# Patient Record
Sex: Male | Born: 1960 | Race: White | Hispanic: No | Marital: Married | Smoking: Never smoker
Health system: Southern US, Community
[De-identification: ages and names within clinical notes are randomized; demographics above are authoritative.]

## PROBLEM LIST (undated history)

## (undated) DIAGNOSIS — E7800 Pure hypercholesterolemia, unspecified: Secondary | ICD-10-CM

## (undated) DIAGNOSIS — K219 Gastro-esophageal reflux disease without esophagitis: Secondary | ICD-10-CM

## (undated) DIAGNOSIS — I471 Supraventricular tachycardia, unspecified: Secondary | ICD-10-CM

## (undated) DIAGNOSIS — F1010 Alcohol abuse, uncomplicated: Secondary | ICD-10-CM

## (undated) DIAGNOSIS — I10 Essential (primary) hypertension: Secondary | ICD-10-CM

## (undated) DIAGNOSIS — F419 Anxiety disorder, unspecified: Secondary | ICD-10-CM

## (undated) HISTORY — PX: RADIOFREQUENCY ABLATION: SHX2290

## (undated) HISTORY — PX: COLONOSCOPY: SHX174

---

## 2006-04-08 ENCOUNTER — Emergency Department (HOSPITAL_COMMUNITY): Admission: EM | Admit: 2006-04-08 | Discharge: 2006-04-08 | Payer: Self-pay | Admitting: *Deleted

## 2006-04-22 ENCOUNTER — Emergency Department (HOSPITAL_COMMUNITY): Admission: EM | Admit: 2006-04-22 | Discharge: 2006-04-22 | Payer: Self-pay | Admitting: Emergency Medicine

## 2006-07-08 ENCOUNTER — Emergency Department (HOSPITAL_COMMUNITY): Admission: EM | Admit: 2006-07-08 | Discharge: 2006-07-08 | Payer: Self-pay | Admitting: Emergency Medicine

## 2007-02-10 ENCOUNTER — Emergency Department (HOSPITAL_COMMUNITY): Admission: EM | Admit: 2007-02-10 | Discharge: 2007-02-10 | Payer: Self-pay | Admitting: Emergency Medicine

## 2007-03-31 ENCOUNTER — Ambulatory Visit: Payer: Self-pay | Admitting: Internal Medicine

## 2007-07-01 ENCOUNTER — Observation Stay (HOSPITAL_COMMUNITY): Admission: RE | Admit: 2007-07-01 | Discharge: 2007-07-01 | Payer: Self-pay | Admitting: Internal Medicine

## 2007-07-01 ENCOUNTER — Ambulatory Visit: Payer: Self-pay | Admitting: Internal Medicine

## 2010-10-06 ENCOUNTER — Encounter: Payer: Self-pay | Admitting: Orthopedic Surgery

## 2011-01-28 NOTE — Discharge Summary (Signed)
Sean Booth, SALADIN NO.:  [id number]   MEDICAL RECORD NO.:  [id number]          PATIENT TYPE:  INP   LOCATION:  3739                         FACILITY:  [facility]   PHYSICIAN:  Duke Salvia, MD, FACCDATE OF BIRTH:  1961/04/20   DATE OF ADMISSION:  07/01/2007  DATE OF DISCHARGE:  07/01/2007                               DISCHARGE SUMMARY   PROCEDURES:  Supraventricular tachycardia ablation.   FINAL DIAGNOSIS:  Supraventricular tachycardia.   SECONDARY DIAGNOSES:  1. Hypertension  2. Hyperlipidemia.   TIME AT DISCHARGE:  Twenty-two minutes.   HOSPITAL COURSE:  [name] is a 49 year old male with a history of  SVT.  He was evaluated by Dr. 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  It was felt that he needed ablation, as his symptoms were  not well controlled on metoprolol 50 mg a day, but his blood pressure  would not tolerate an increase.  He came to the hospital for this on  July 01, 2007.  [name] had successful radiofrequency catheter  ablation of narrow complex AVNRT with a cycle length of 297  milliseconds.  Postprocedure, he was without chest pain or shortness of  breath.  His catheter sites were without hematoma.  He was discharged on  July 01, 2007 and is to follow up as an outpatient with the Dr.  Eldridge Dace.   DISCHARGE INSTRUCTIONS:  1. His activity level is to be increased gradually.  2. He is to call our office for any problems with his cath site.  3. He is to keep his followup appointment with Dr. Eldridge Dace in      December.  4. He is to follow up with Dr. Clarene Duke and Dr. Graciela Husbands as needed.   DISCHARGE MEDICATIONS:  1. Lisinopril 40 mg daily.  2. Metoprolol 50 mg daily.  3. Simvastatin 40 mg daily.      Theodore Demark, PA-C      Duke Salvia, MD, [facility]  Electronically Signed    RB/MEDQ  D:  07/01/2007  T:  07/03/2007  Job:  [id number]   cc:   [name], MD  Anna Genre Little, M.D.

## 2011-01-28 NOTE — Op Note (Signed)
NAMEAVANT, PRINTY NO.:  [id number]   MEDICAL RECORD NO.:  [id number]          PATIENT TYPE:  INP   LOCATION:  3739                         FACILITY:  [facility]   PHYSICIAN:  Duke Salvia, MD, FACCDATE OF BIRTH:  1961/02/06   DATE OF PROCEDURE:  07/01/2007  DATE OF DISCHARGE:  07/01/2007                               OPERATIVE REPORT   PREOPERATIVE DIAGNOSIS:  Supraventricular tachycardia.   POSTOPERATIVE DIAGNOSIS:  Slow--fast atrioventricular nodal reentry  tachycardia.   PROCEDURE:  1. Invasive electrophysiological study.  2. Arrhythmia mapping.  3. Isoproterenol infusion and radiofrequency catheter ablation.   DESCRIPTION OF PROCEDURE:  Following the attainment of informed consent,  the patient was brought to the electrophysiology laboratory and placed  on the fluoroscopic table in supine position.  After routine prep and  drape, cardiac catheterization was performed with local anesthesia and  conscious sedation.  Noninvasive blood pressure monitoring,  transcutaneous oxygen saturation monitoring and end-tidal CO2 monitoring  were performed continuously throughout the procedure.  Following the  procedure, the catheters were removed, hemostasis was obtained and the  patient was transferred to the holding area which sheaths in place.   Surface leads I, aVF and V1 were monitored continuously throughout the  procedure.  Following insertion of the catheters, the stimulation  protocol included incremental atrial pacing and incremental ventricular  pacing.   Single atrial extrastimuli at 600, 500 and 400 milliseconds with double  atrial extrastimuli at 500 and 400 milliseconds.   Single ventricular extrastimuli at a paced cycle length of 600  milliseconds.   RESULTS:   SURFACE ELECTROCARDIOGRAM:   BASIC INTERVALS:   INITIAL:  Rhythm:  Sinus.  R-R interval 698 milliseconds.  P-R interval  140 milliseconds.  QRS duration 86 milliseconds.  P-wave  duration 116  milliseconds.  Bundle branch block:  Absent.  Pre-excitation:  Absent.  A-H interval 89 milliseconds; H-A interval 41 milliseconds.   FINAL:  Rhythm:  Sinus.  R-R interval 681 milliseconds.  P-R interval  145 milliseconds.  QRS duration 86 milliseconds.  Q-T interval 363  milliseconds.  P-wave duration 98 milliseconds.  Bundle branch block:  Absent.  Pre-excitation:  Absent.   A-H interval 66 milliseconds.  H-A interval 42 milliseconds.   AV NODAL FUNCTION.:  AV Wenckebach was less than 300 milliseconds.  VA  Wenckebach was 320 milliseconds.   AV nodal effective refractory period of the fast pathway pre ablation  was very short.  AV nodal conduction was discontinuous with echoes and  inducible tachycardia.   Post ablation:  In the absence of isoproterenol, no evidence of slow  pathway conduction was identified with an ERP at 500:360 milliseconds.  In the presence of isoproterenol, an isolated slowly conducting echo  beat was seen with an A-H interval of 340 milliseconds.   ACCESSORY PATHWAY FUNCTION:  No evidence of accessory pathway was  identified.   Ventricular response to programmed stimulation was negative for  ventricular stimulation as noted.   ARRHYTHMIAS INDUCED:  Slow-fast AV nodal reentry tachycardia was non-  reproducibly inducible with coronary sinus burst pacing.  The  tachycardia was both narrow  and with right bundle branch block  aberration, cycle length was approximately 290-300 milliseconds.  V-A  times were 0.  A-H time were 260 milliseconds and H-A times were about  35 milliseconds.  It could be reproducibly terminated with ventricular  pacing at 250 milliseconds, although sometimes it resulted in right  bundle branch block aberration.   Following RF application, no further tachycardia was inducible and the  end points were as noted previously; that is to say, no evidence of slow  pathway function was seen in the absence of isoproterenol.  An  isolated  echo beat with a long A-H interval was seen with isoproterenol.   FLUOROSCOPY TIME:  A total of 5 minutes and 53 seconds of fluoroscopy  time were utilized at 7.5 frames per second.   RADIOFREQUENCY ENERGY:  A total of 35 seconds of RF energy were applied  in 3 applications, the first of which induced no junctional rhythm, the  second of which induced junctional rhythm, but there was dislodgement  inferocaudally of the catheter and the third of which was continued  along towards the CS os, but not continued all the way back to the CS  os.   IMPRESSION:  1. Normal sinus function.  2. Normal atrial function.  3. Abnormal atrioventricular nodal function with dual slow-fast      atrioventricular nodal reentry tachycardia inducible; this required      isoproterenol.  Following slow pathway modification, no evidence of      antegrade slow pathway function was seen in the absence of      isoproterenol and an isolated echo was seen in the presence of      isoproterenol without induction of tachycardia.  4. Normal His-Purkinje system function.  5. No accessory pathway.  6. Normal ventricular response to programmed stimulation.   SUMMARY AND CONCLUSION:  The results of electrophysiological testing  identified slow-fast atrioventricular nodal reentrant tachycardia as the  patient's arrhythmia mechanism.  Slow pathway modification eliminated  the  substrate for the patient's arrhythmia.  This is associated with a long-  term likelihood of freedom from recurrence of greater than 95%.   The patient will be observed overnight.  Aspirin daily for 6 weeks and  endocarditis prophylaxis for 6 weeks.      Duke Salvia, MD, [facility]  Electronically Signed     SCK/MEDQ  D:  07/01/2007  T:  07/02/2007  Job:  [id number]   cc:   [name], MD  [facility]

## 2011-01-28 NOTE — Letter (Signed)
March 31, 2007    Sean Crafts, MD  [address] [place], [place] [address]   RE:  Sean, Booth  MRN:  [id number]  /  DOB:  May 31, 1961   Dear Sean Booth:   It was a pleasure to see Sean Booth at your request because  of supraventricular tachycardia.   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  They are associated with some mild degree of shortness of  breath, some clamminess, but no significant lightheadedness or chest  pain.  They are FROG  negative and diuretic negative.  He has tried  Valsalva to no effect and hence has required the IV.   He has undergone a cardiac evaluation which has included echo that was  normal.  He also has hypertension, dyslipidemia and obesity and for the  former he was put on a beta blocker, despite which he has continued to  have tachycardia.   He thinks that he may have had some remote episodes years and years ago.   PAST MEDICAL HISTORY:  In addition to the above is notable for his  cardiac risk factors and obesity.   PAST SURGICAL HISTORY:  Is notable for some eye surgery as an infant.   REVIEW OF SYSTEMS:  Broadly negative apart from the above.   CURRENT MEDICATIONS:  Include:  1. Lisinopril 40.  2. Metoprolol 50.  3. Simvastatin 40.   He has no known drug allergies.   SOCIAL HISTORY:  He is married.  He has a 16-year-old daughter, though I  am seeing the number of children here is misquoted with that.  He does  not use cigarettes or recreational drugs.  He does drink alcohol up to 4  bourbons a day.   EXAMINATION:  He is a well-developed, well-nourished Caucasian male  appearing his stated age of 31.  His blood pressure is 138/106 and a recheck was 118/98.  His pulse is  90.  HEENT:  Demonstrates __________.  The neck  veins were flat.  Carotids were brisk and full bilaterally  without bruits.  BACK:  Without kyphosis, scoliosis.  LUNGS:  Clear.  HEART:  Sounds were regular without murmurs or gallops.  ABDOMEN:  Soft with active bowel sounds.  It was protuberant.  The femoral pulses were 2+, distal pulses were intact.  There is no  clubbing, cyanosis or edema.  NEUROLOGICAL:  Grossly normal.  SKIN:  Warm and dry.   Electrocardiogram dated today demonstrated sinus Sean 90 with  intervals of 0.15/0.08/0.35, the axis was 40 degrees.   A tracing of his tachycardia demonstrated a narrow QRS tachycardia that  terminated abruptly with a wide complex irregular Sean.  There has  been other episodes of the same wide complex Sean within the  presumption of sinus Sean.   IMPRESSION:  1. Supraventricular tachycardia, probably AV reentry.  2. Normal left ventricular function.  3. Cardiac risk factor notable for the metabolic syndrome manifest by      a.     Abdominal obesity.      b.     Hypertension.      c.     Dyslipidemia.  4. Notable alcohol use.   Sean Booth, Sean Booth has supraventricular tachycardia, probably  mediated by an accessory  pathway.  We discussed treatment options  including ongoing therapy with beta blockers with the potential for  proarrhythmia and EP study with a catheter ablation.  We talked about  the potential successes as well as the potential risks including but not  limited to death, perforation, heart block, requiring pacemaker  implantation, stroke and perforation.  He understands these risks.  He  would like to consider this further.   In addition, I spent some time encouraging him on weight loss and long  term risk factor modification.  I did not approach risk factor  modification.   He is to let us know if he would like to proceed with catheter ablation.    Sincerely,      Sean Salvia, MD, [facility]  Electronically Signed    SCK/MedQ  DD: 03/31/2007   DT: 04/01/2007  Job #: [id number]   CC:    Sean Booth, M.D.

## 2011-06-25 LAB — DIFFERENTIAL
Basophils Absolute: 0
Basophils Relative: 0
Eosinophils Absolute: 0.1
Eosinophils Relative: 1
Lymphocytes Relative: 27
Lymphs Abs: 1.6
Monocytes Absolute: 0.4
Monocytes Relative: 7

## 2011-06-25 LAB — CBC
MCV: 90.9
Platelets: 188
RDW: 12.7
WBC: 5.9

## 2011-06-25 LAB — BASIC METABOLIC PANEL
CO2: 26
Calcium: 9.6
Creatinine, Ser: 1.16
GFR calc non Af Amer: >60
Glucose, Bld: 103 — ABNORMAL HIGH
Sodium: 137

## 2011-06-25 LAB — PROTIME-INR
INR: 0.9
Prothrombin Time: 12.6

## 2011-06-25 LAB — APTT: aPTT: 25

## 2012-02-18 ENCOUNTER — Emergency Department (HOSPITAL_COMMUNITY): Payer: 59

## 2012-02-18 ENCOUNTER — Other Ambulatory Visit: Payer: Self-pay

## 2012-02-18 ENCOUNTER — Encounter (HOSPITAL_COMMUNITY): Payer: Self-pay | Admitting: Emergency Medicine

## 2012-02-18 ENCOUNTER — Observation Stay (HOSPITAL_COMMUNITY)
Admission: EM | Admit: 2012-02-18 | Discharge: 2012-02-19 | Disposition: A | Discharge status: Home / Self Care | Payer: 59 | Source: Ambulatory Visit | Attending: Interventional Cardiology | Admitting: Interventional Cardiology

## 2012-02-18 DIAGNOSIS — I10 Essential (primary) hypertension: Secondary | ICD-10-CM | POA: Diagnosis present

## 2012-02-18 DIAGNOSIS — R0789 Other chest pain: Principal | ICD-10-CM | POA: Insufficient documentation

## 2012-02-18 DIAGNOSIS — F1011 Alcohol abuse, in remission: Secondary | ICD-10-CM | POA: Diagnosis present

## 2012-02-18 DIAGNOSIS — R000 Tachycardia, unspecified: Secondary | ICD-10-CM

## 2012-02-18 DIAGNOSIS — R079 Chest pain, unspecified: Secondary | ICD-10-CM

## 2012-02-18 DIAGNOSIS — I471 Supraventricular tachycardia: Secondary | ICD-10-CM | POA: Insufficient documentation

## 2012-02-18 DIAGNOSIS — Z789 Other specified health status: Secondary | ICD-10-CM | POA: Diagnosis present

## 2012-02-18 DIAGNOSIS — E785 Hyperlipidemia, unspecified: Secondary | ICD-10-CM | POA: Insufficient documentation

## 2012-02-18 DIAGNOSIS — F1010 Alcohol abuse, uncomplicated: Secondary | ICD-10-CM | POA: Insufficient documentation

## 2012-02-18 DIAGNOSIS — F411 Generalized anxiety disorder: Secondary | ICD-10-CM | POA: Insufficient documentation

## 2012-02-18 DIAGNOSIS — E7800 Pure hypercholesterolemia, unspecified: Secondary | ICD-10-CM | POA: Insufficient documentation

## 2012-02-18 HISTORY — DX: Gastro-esophageal reflux disease without esophagitis: K21.9

## 2012-02-18 HISTORY — DX: Anxiety disorder, unspecified: F41.9

## 2012-02-18 HISTORY — DX: Pure hypercholesterolemia, unspecified: E78.00

## 2012-02-18 HISTORY — DX: Essential (primary) hypertension: I10

## 2012-02-18 HISTORY — DX: Supraventricular tachycardia: I47.1

## 2012-02-18 HISTORY — DX: Supraventricular tachycardia, unspecified: I47.10

## 2012-02-18 LAB — COMPREHENSIVE METABOLIC PANEL
AST: 38 U/L — ABNORMAL HIGH (ref 0–37)
Albumin: 4.2 g/dL (ref 3.5–5.2)
Calcium: 9.6 mg/dL (ref 8.4–10.5)
Creatinine, Ser: 1 mg/dL (ref 0.50–1.35)
GFR calc non Af Amer: 85 mL/min — ABNORMAL LOW (ref >90)

## 2012-02-18 LAB — BASIC METABOLIC PANEL
CO2: 22 mEq/L (ref 19–32)
Calcium: 9.7 mg/dL (ref 8.4–10.5)
Potassium: 3.6 mEq/L (ref 3.5–5.1)
Sodium: 131 mEq/L — ABNORMAL LOW (ref 135–145)

## 2012-02-18 LAB — CBC
HCT: 41.6 % (ref 39.0–52.0)
Hemoglobin: 15.2 g/dL (ref 13.0–17.0)
MCH: 31.7 pg (ref 26.0–34.0)
MCH: 31.3 pg (ref 26.0–34.0)
MCHC: 35.1 g/dL (ref 30.0–36.0)
MCV: 89.3 fL (ref 78.0–100.0)
Platelets: 204 10*3/uL (ref 150–400)
RBC: 4.79 MIL/uL (ref 4.22–5.81)
RDW: 12.6 % (ref 11.5–15.5)
WBC: 6.6 10*3/uL (ref 4.0–10.5)

## 2012-02-18 LAB — DIFFERENTIAL
Basophils Absolute: 0 10*3/uL (ref 0.0–0.1)
Basophils Relative: 0 % (ref 0–1)
Eosinophils Relative: 0 % (ref 0–5)
Monocytes Absolute: 0.4 10*3/uL (ref 0.1–1.0)

## 2012-02-18 LAB — POCT I-STAT TROPONIN I
Troponin i, poc: 0.01 ng/mL (ref 0.00–0.08)
Troponin i, poc: 0 ng/mL (ref 0.00–0.08)

## 2012-02-18 LAB — CARDIAC PANEL(CRET KIN+CKTOT+MB+TROPI)
Relative Index: 1.9 (ref 0.0–2.5)
Relative Index: 1.7 (ref 0.0–2.5)
Total CK: 167 U/L (ref 7–232)

## 2012-02-18 LAB — TSH: TSH: 1.44 u[IU]/mL (ref 0.350–4.500)

## 2012-02-18 MED ORDER — ASPIRIN EC 81 MG PO TBEC
81.0000 mg | DELAYED_RELEASE_TABLET | Freq: Every day | ORAL | Status: DC
  Administered 2012-02-19: 81 mg via ORAL
  Filled 2012-02-18: qty 1

## 2012-02-18 MED ORDER — ASPIRIN 81 MG PO CHEW: 324.0000 mg | CHEWABLE_TABLET | ORAL | Status: DC

## 2012-02-18 MED ORDER — HYPROMELLOSE (GONIOSCOPIC) 2.5 % OP SOLN: 1.0000 [drp] | Freq: Three times a day (TID) | OPHTHALMIC | Status: DC | PRN

## 2012-02-18 MED ORDER — ALUM & MAG HYDROXIDE-SIMETH 200-200-20 MG/5ML PO SUSP
15.0000 mL | ORAL | Status: DC | PRN
  Administered 2012-02-18: 15 mL via ORAL
  Filled 2012-02-18: qty 30

## 2012-02-18 MED ORDER — AMLODIPINE BESYLATE 5 MG PO TABS
5.0000 mg | ORAL_TABLET | Freq: Every day | ORAL | Status: DC
  Administered 2012-02-18 – 2012-02-19 (×2): 5 mg via ORAL
  Filled 2012-02-18 (×2): qty 1

## 2012-02-18 MED ORDER — ZOLPIDEM TARTRATE 5 MG PO TABS
10.0000 mg | ORAL_TABLET | Freq: Every evening | ORAL | Status: DC | PRN
  Administered 2012-02-18: 10 mg via ORAL
  Filled 2012-02-18: qty 2

## 2012-02-18 MED ORDER — POLYVINYL ALCOHOL 1.4 % OP SOLN
1.0000 [drp] | Freq: Three times a day (TID) | OPHTHALMIC | Status: DC | PRN
  Filled 2012-02-18: qty 15

## 2012-02-18 MED ORDER — LORAZEPAM 0.5 MG PO TABS: 0.5000 mg | ORAL_TABLET | ORAL | Status: DC | PRN

## 2012-02-18 MED ORDER — LISINOPRIL 40 MG PO TABS
40.0000 mg | ORAL_TABLET | Freq: Every day | ORAL | Status: DC
  Administered 2012-02-19: 40 mg via ORAL
  Filled 2012-02-18: qty 1

## 2012-02-18 MED ORDER — ASPIRIN 81 MG PO CHEW
324.0000 mg | CHEWABLE_TABLET | Freq: Once | ORAL | Status: AC
  Administered 2012-02-18: 324 mg via ORAL
  Filled 2012-02-18: qty 4

## 2012-02-18 MED ORDER — ASPIRIN 300 MG RE SUPP
300.0000 mg | RECTAL | Status: DC
  Filled 2012-02-18: qty 1

## 2012-02-18 MED ORDER — ALPRAZOLAM 0.25 MG PO TABS: 0.5000 mg | ORAL_TABLET | Freq: Three times a day (TID) | ORAL | Status: DC | PRN

## 2012-02-18 MED ORDER — ACETAMINOPHEN 325 MG PO TABS: 650.0000 mg | ORAL_TABLET | ORAL | Status: DC | PRN

## 2012-02-18 MED ORDER — GI COCKTAIL ~~LOC~~
30.0000 mL | Freq: Once | ORAL | Status: AC
  Administered 2012-02-18: 30 mL via ORAL
  Filled 2012-02-18: qty 30

## 2012-02-18 MED ORDER — NITROGLYCERIN 0.4 MG SL SUBL
0.4000 mg | SUBLINGUAL_TABLET | SUBLINGUAL | Status: DC | PRN
  Filled 2012-02-18: qty 25

## 2012-02-18 MED ORDER — METOPROLOL SUCCINATE ER 50 MG PO TB24
50.0000 mg | ORAL_TABLET | Freq: Every day | ORAL | Status: DC
Start: 2012-02-19 — End: 2012-02-19
  Administered 2012-02-19: 50 mg via ORAL
  Filled 2012-02-18: qty 1

## 2012-02-18 MED ORDER — LORAZEPAM 1 MG PO TABS
1.0000 mg | ORAL_TABLET | Freq: Once | ORAL | Status: AC
  Administered 2012-02-18: 1 mg via ORAL
  Filled 2012-02-18: qty 1

## 2012-02-18 MED ORDER — SIMVASTATIN 20 MG PO TABS
20.0000 mg | ORAL_TABLET | Freq: Every evening | ORAL | Status: DC
  Administered 2012-02-18: 20 mg via ORAL
  Filled 2012-02-18 (×2): qty 1

## 2012-02-18 MED ORDER — PANTOPRAZOLE SODIUM 40 MG PO TBEC
40.0000 mg | DELAYED_RELEASE_TABLET | Freq: Every day | ORAL | Status: DC
  Administered 2012-02-19: 40 mg via ORAL
  Filled 2012-02-18: qty 1

## 2012-02-18 MED ORDER — ENOXAPARIN SODIUM 40 MG/0.4ML ~~LOC~~ SOLN
40.0000 mg | SUBCUTANEOUS | Status: DC
  Administered 2012-02-18: 40 mg via SUBCUTANEOUS
  Filled 2012-02-18 (×2): qty 0.4

## 2012-02-18 MED ORDER — ONDANSETRON HCL 4 MG/2ML IJ SOLN: 4.0000 mg | Freq: Four times a day (QID) | INTRAMUSCULAR | Status: DC | PRN

## 2012-02-18 MED ORDER — METOPROLOL TARTRATE 1 MG/ML IV SOLN
5.0000 mg | Freq: Once | INTRAVENOUS | Status: AC
  Administered 2012-02-18: 5 mg via INTRAVENOUS
  Filled 2012-02-18: qty 5

## 2012-02-18 MED ORDER — NITROGLYCERIN 0.4 MG SL SUBL: 0.4000 mg | SUBLINGUAL_TABLET | SUBLINGUAL | Status: DC | PRN

## 2012-02-18 NOTE — ED Notes (Signed)
Pt d/c'd from monitor, continuous pulse oximetry, blood pressure cuff and oxygen [place] (2L); pt being transported to radiology dept

## 2012-02-18 NOTE — ED Provider Notes (Signed)
History     CSN: [id number]  Arrival date & time 02/18/12  1127   First MD Initiated Contact with Patient 02/18/12 1217      Chief Complaint  Patient presents with  . Chest Pain    HPI Pt was seen at 1245.  Per pt, c/o gradual onset and persistence of multiple intermittent episodes of left sided chest "pain" since last night.  Describes the CP as "tightness."  Has been occuring intermittently since last night, lasting approx 20-33min per episode.  States he "walked 3 miles" today and felt better, but then the chest pain came back when he went to work afterwards.  Denies palpitations, no SOB/cough, no back pain, no abd pain, no N/V/D.     Past Medical History  Diagnosis Date  . Hypertension   . Hypercholesteremia   . SVT (supraventricular tachycardia)     Past Surgical History  Procedure Date  . Radiofrequency ablation     for SVT (01/2011)    History  Substance Use Topics  . Smoking status: Never Smoker   . Smokeless tobacco: Not on file  . Alcohol Use: Yes     heavy drinker    Review of Systems ROS: Statement: All systems negative except as marked or noted in the HPI; Constitutional: Negative for fever and chills. ; ; Eyes: Negative for eye pain, redness and discharge. ; ; ENMT: Negative for ear pain, hoarseness, nasal congestion, sinus pressure and sore throat. ; ; Cardiovascular: +CP.  Negative for palpitations, diaphoresis, dyspnea and peripheral edema. ; ; Respiratory: Negative for cough, wheezing and stridor. ; ; Gastrointestinal: Negative for nausea, vomiting, diarrhea, abdominal pain, blood in stool, hematemesis, jaundice and rectal bleeding. . ; ; Genitourinary: Negative for dysuria, flank pain and hematuria. ; ; Musculoskeletal: Negative for back pain and neck pain. Negative for swelling and trauma.; ; Skin: Negative for pruritus, rash, abrasions, blisters, bruising and skin lesion.; ; Neuro: Negative for headache, lightheadedness and neck stiffness. Negative for  weakness, altered level of consciousness , altered mental status, extremity weakness, paresthesias, involuntary movement, seizure and syncope.     Allergies  Review of patient's allergies indicates no known allergies.  Home Medications  No current outpatient prescriptions on file.  BP 142/100  Pulse 107  Temp(Src) 97.3 F (36.3 C) (Oral)  Resp 18  SpO2 96%  Physical Exam 1250: Physical examination:  Nursing notes reviewed; Vital signs and O2 SAT reviewed;  Constitutional: Well developed, Well nourished, Well hydrated, In no acute distress; Head:  Normocephalic, atraumatic; Eyes: EOMI, PERRL, No scleral icterus; ENMT: Mouth and pharynx normal, Mucous membranes moist; Neck: Supple, Full range of motion, No lymphadenopathy; Cardiovascular: Tachycardic rate and rhythm, No murmur or gallop; Respiratory: Breath sounds clear & equal bilaterally, No rales, rhonchi, wheezes, speaking full sentences with ease, Normal respiratory effort/excursion; Chest: Nontender, Movement normal; Abdomen: Soft, Nontender, Nondistended, Normal bowel sounds; Extremities: Pulses normal, No tenderness, No edema, No calf edema or asymmetry.; Neuro: AA&Ox3, Major CN grossly intact. Speech clear.  No gross focal motor or sensory deficits in extremities.; Skin: Color normal, Warm, Dry.    ED Course  Procedures   MDM  MDM Reviewed: previous chart, nursing note and vitals Reviewed previous: ECG Interpretation: ECG, labs and x-ray    Date: 02/18/2012  Rate: 136  Rhythm: sinus tachycardia  QRS Axis: normal  Intervals: normal  ST/T Wave abnormalities: normal  Conduction Disutrbances:none  Narrative Interpretation:   Old EKG Reviewed: unchanged; no significant changes from previous EKG dated 07/01/2007.  Results for orders placed during the hospital encounter of 02/18/12  CBC      Component Value Range   WBC 6.6  4.0 - 10.5 (K/uL)   RBC 4.79  4.22 - 5.81 (MIL/uL)   Hemoglobin 15.2  13.0 - 17.0 (g/dL)   HCT  16.1  09.6 - 04.5 (%)   MCV 89.4  78.0 - 100.0 (fL)   MCH 31.7  26.0 - 34.0 (pg)   MCHC 35.5  30.0 - 36.0 (g/dL)   RDW 40.9  81.1 - 91.4 (%)   Platelets 204  150 - 400 (K/uL)  BASIC METABOLIC PANEL      Component Value Range   Sodium 131 (*) 135 - 145 (mEq/L)   Potassium 3.6  3.5 - 5.1 (mEq/L)   Chloride 94 (*) 96 - 112 (mEq/L)   CO2 22  19 - 32 (mEq/L)   Glucose, Bld 123 (*) 70 - 99 (mg/dL)   BUN 19  6 - 23 (mg/dL)   Creatinine, Ser 7.82  0.50 - 1.35 (mg/dL)   Calcium 9.7  8.4 - 95.6 (mg/dL)   GFR calc non Af Amer 77 (*) >90 (mL/min)   GFR calc Af Amer 89 (*) >90 (mL/min)  POCT I-STAT TROPONIN I      Component Value Range   Troponin i, poc 0.01  0.00 - 0.08 (ng/mL)   Comment 3            Dg Chest 2 View 02/18/2012  *RADIOLOGY REPORT*  Clinical Data: Chest pain  CHEST - 2 VIEW  Comparison: None.  Findings: Cardiomediastinal silhouette is unremarkable.  No acute infiltrate or pleural effusion.  No pulmonary edema.  Mild degenerative changes thoracic spine.  IMPRESSION: No active disease.  Mild degenerative changes thoracic spine.  Original Report Authenticated By: Natasha Mead, M.D.      1325:  Pt feels he is improving while he has been in the ED.  Dx testing d/w pt.  Questions answered.  Verb understanding, agreeable to eval by Cards MD in the ED. T/C to [facility] Cards Dr. Anne Fu, case discussed, including:  HPI, pertinent PM/SHx, VS/PE, dx testing, ED course and treatment:  Agreeable to likely admit for observation, he will come to the ED for eval.   1500:  2nd POC troponin continues without elevation.  Cards MD has eval pt, will admit for further observation.  Pt now endorses that he "has been cutting down drinking" recently, hx heavy etoh use.  Question if tachycardia and hypertension due to etoh withdrawal.  Will dose ativan.            Laray Anger, DO 02/20/12 1444

## 2012-02-18 NOTE — H&P (Signed)
Admit date: 02/18/2012 Primary Physician  : Dr. Catha Gosselin  Primary Cardiologist  : Dr. Eldridge Dace  CC: Chest pain   HPI: 51 year old male with prior history of SVT ablation by Dr. 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 Yesterday he states that he was having chest pain radiating across his chest wall and left arm off and on throughout the evening. He describes it as a squeezing-like sensation. He also describes that he wants to punch his chest to help make it feel better or clear it up. He went to sleep. Earlier this morning he went for a walk with his wife, went to work then developed chest pain once again and was quite concerned about it. He had no diaphoresis, no shortness of breath, no nausea, no palpitations. He states that he is drinking "a lot less ". In 2007 he underwent exercise treadmill test which was low risk. He also states that he is taking his metoprolol as well as lisinopril. His blood pressure in the emergency department has been running 150/100. About 2 weeks ago he underwent a colonoscopy and had similar blood pressures in the waiting area. He admits to white coat hypertension. He battles with anxiety as well.    PMH:   Past Medical History  Diagnosis Date  . Hypertension   . Hypercholesteremia   . SVT (supraventricular tachycardia)     PSH:   Past Surgical History  Procedure Date  . Radiofrequency ablation     for SVT (01/2011)   Allergies:  Review of patient's allergies indicates no known allergies. Prior to Admit Meds:   (Not in a hospital admission) home medications reviewed including metoprolol ER 25 mg a day, lisinopril 40 mg a day. He also takes lorazepam Fam HX:   History reviewed. No pertinent family history. his father had a heart attack during his surgery. His heart damage was later discovered approximately 10 years after surgery. Social HX:    History   Social History  .  Marital Status: Married    Spouse Name: N/A    Number of Children: N/A  . Years of Education: N/A   Occupational History  . Not on file.   Social History Main Topics  . Smoking status: Never Smoker   . Smokeless tobacco: Not on file  . Alcohol Use: Yes     heavy drinker  . Drug Use: No  . Sexually Active:    Other Topics Concern  . Not on file   Social History Narrative  . No narrative on file     ROS: No fevers, no chills, no cough, no rashes, no dysphasia. No orthopnea, no syncope, no palpitations. All 11 ROS were addressed and are negative except what is stated in the HPI  Physical Exam: Blood pressure 154/103, pulse 110, temperature 97.3 F (36.3 C), temperature source Oral, resp. rate 18, SpO2 96.00%.    General: Well developed, well nourished, in no acute distress Head: Eyes PERRLA, No xanthomas.   Normal cephalic and atramatic  Lungs:   Clear bilaterally to auscultation and percussion. Normal respiratory effort. No wheezes, no rales. Heart:   Tachycardic regular heart rate in the 100s S1 S2 Pulses are 2+ & equal. No murmur.   No carotid bruit. No JVD.  No abdominal bruits. No femoral bruits. Abdomen: Bowel sounds are positive, abdomen soft and non-tender without masses or  Hernia's noted. No hepatosplenomegaly. Msk:  Back normal Normal strength and tone for age. Extremities:   No clubbing, cyanosis or edema.  DP +1 Neuro: Alert and oriented X 3, non-focal, MAE x 4 GU: Deferred Rectal: Deferred Psych:  Good affect, responds appropriately, appears somewhat anxious.    Labs:   Lab Results  Component Value Date   WBC 6.6 02/18/2012   HGB 15.2 02/18/2012   HCT 42.8 02/18/2012   MCV 89.4 02/18/2012   PLT 204 02/18/2012    Lab 02/18/12 1134  NA 131*  K 3.6  CL 94*  CO2 22  BUN 19  CREATININE 1.09  CALCIUM 9.7  PROT --  BILITOT --  ALKPHOS --  ALT --  AST --  GLUCOSE 123*   No results found for this basename: PTT   Lab Results  Component  Value Date   INR 0.9 07/01/2007    Radiology:  Dg Chest 2 View  02/18/2012  *RADIOLOGY REPORT*  Clinical Data: Chest pain  CHEST - 2 VIEW  Comparison: None.  Findings: Cardiomediastinal silhouette is unremarkable.  No acute infiltrate or pleural effusion.  No pulmonary edema.  Mild degenerative changes thoracic spine.  IMPRESSION: No active disease.  Mild degenerative changes thoracic spine.  Original Report Authenticated By: Natasha Mead, M.D.   Personally viewed.   EKG:  Sinus tachycardia with no ST segment changes Personally viewed.  ASSESSMENT/PLAN:  51 year old male with uncontrolled hypertension/degree of white coat hypertension, chest pain, alcohol use, prior SVT, anxiety.  Chest pain  - Possible etiologies include musculoskeletal, possible GERD or possibly cardiac in origin. He is a nonsmoker, nondiabetic but he has quite severe hypertension currently.  His anxiety level is quite high. My suspicion for PE is quite low. I believe that his tachycardia his anxiety related as it has decreased since we were talking. He has had tachycardia on prior office visits. He has not had a history of thrombosis. Hypertension may also be playing a role in his chest pain and supply demand mismatch. Aortic contours appear normal. Low suspicion for dissection based on history.   - First point-of-care cardiac troponin is normal. Reassuring. No ST segment changes on EKG, reassuring. Nonetheless, I would like to observe him overnight and make him n.p.o. past midnight in case stress test versus cardiac catheterization. Dr. Eldridge Dace will decide upon further testing.  - He tells me that he would not be opposed to cardiac catheterization. We went over risks and benefits.  Hypertension  - Early there is a degree of white coat hypertension involved. Anti-anxiety medication. Nonetheless, I will give him amlodipine 5 mg. I will increase his metoprolol to 50 mg a day. I will give him an extra 5mg  IV now. Continue with  lisinopril 40 mg. He does state that occasionally at home he will have blood pressures of 117 over 70s.  Anxiety  - I will write for when necessary Xanax. Ambien for sleep.  Alcohol use  - Encourage cessation.   Donato Schultz, MD  02/18/2012  3:10 PM

## 2012-02-18 NOTE — ED Notes (Signed)
Clarene Duke, MD in with pt at this time

## 2012-02-18 NOTE — ED Notes (Signed)
Pt c/o left sided CP starting some yesterday and getting worse today; pt sts hx of ablation for SVT in past; pt tachycardic at present but sts took beta blocker this am; pt denies SOB, nausea or dizziness

## 2012-02-18 NOTE — ED Notes (Signed)
Patient C/O chest tightness that began yesterday. States that he went walking this AM and did fine. When he arrived at work this AM, the left chest tightness returned with some tingling in his left arm.  C/O transient right shoulder tightness this AM.  States that he had a colonoscopy 9 days ago and has not felt right since.

## 2012-02-18 NOTE — ED Notes (Addendum)
EDP speaking with pt regarding poc. Pt reports generalized discomfort to chest, states very mild, with no associated symptoms. Pt remains on monitor.

## 2012-02-18 NOTE — ED Notes (Signed)
Pt has returned from being out of the department; pt placed back on monitor, continuous pulse oximetry and blood pressure cuff; pt states he doesn't feel like he needs the oxygen

## 2012-02-19 LAB — BASIC METABOLIC PANEL
GFR calc Af Amer: 85 mL/min — ABNORMAL LOW (ref >90)
GFR calc non Af Amer: 74 mL/min — ABNORMAL LOW (ref >90)
Glucose, Bld: 98 mg/dL (ref 70–99)
Potassium: 3.7 mEq/L (ref 3.5–5.1)
Sodium: 134 mEq/L — ABNORMAL LOW (ref 135–145)

## 2012-02-19 LAB — CARDIAC PANEL(CRET KIN+CKTOT+MB+TROPI)
Relative Index: 1.7 (ref 0.0–2.5)
Total CK: 133 U/L (ref 7–232)
Troponin I: <0.3 ng/mL (ref <0.30)

## 2012-02-19 LAB — CBC
Hemoglobin: 13.7 g/dL (ref 13.0–17.0)
MCHC: 34.7 g/dL (ref 30.0–36.0)
Platelets: 166 10*3/uL (ref 150–400)

## 2012-02-19 LAB — HEMOGLOBIN A1C
Hgb A1c MFr Bld: 5.6 % (ref <5.7)
Mean Plasma Glucose: 114 mg/dL (ref <117)

## 2012-02-19 LAB — LIPID PANEL
Cholesterol: 138 mg/dL (ref 0–200)
HDL: 65 mg/dL (ref >39)
LDL Cholesterol: 19 mg/dL (ref 0–99)
Triglycerides: 271 mg/dL — ABNORMAL HIGH (ref <150)

## 2012-02-19 MED ORDER — AMLODIPINE BESYLATE 5 MG PO TABS: 5.0000 mg | ORAL_TABLET | Freq: Every day | ORAL | Status: DC

## 2012-02-19 MED ORDER — ASPIRIN 81 MG PO TBEC: 81.0000 mg | DELAYED_RELEASE_TABLET | Freq: Every day | ORAL | Status: AC

## 2012-02-19 MED ORDER — PANTOPRAZOLE SODIUM 40 MG PO TBEC: 40.0000 mg | DELAYED_RELEASE_TABLET | Freq: Every day | ORAL | Status: DC

## 2012-02-19 NOTE — Progress Notes (Signed)
Pt. Discharged 02/19/2012  10:10 AM Discharge instructions reviewed with patient/family. Patient/family verbalized understanding. All Rx's given. Questions answered as needed. Pt. Discharged to home with family/self.  Rissa Turley, [employer]

## 2012-02-19 NOTE — Discharge Summary (Signed)
Patient ID: Sean Booth MRN: [id number] DOB/AGE: [birth year] 51 y.o.  Admit date: 02/18/2012 Discharge date: 02/19/2012  Primary Discharge Diagnosis Atypical chest pain Secondary Discharge Diagnosis HTN, hyperlipidemia  Significant Diagnostic Studies: none  Consults: None  Hospital Course: 51 y/o who was admitted with atypical chest pain that was essentially constant for about 22 hours.  He has not felt completely right since his colonoscopy 2 weeks ago.  The sx felt like indigestion but he ran into a neighbor of his while walking who thought it may have been his heart.  He did not have any discomfort while walking.  He came to the ER and was HTN and tachycardic.  Once he found out that the ECG and blood tests were normal, he was more relaxed.  He has not had any significant pain the hospital or with walking.  BP in the hospital was high at times, but better controlled with amlodipine.  He also received a PPI.  We will continue these medications at d/c.  He will be scheduled for a stress test.  He is comfortable with this.    Discharge Exam: Blood pressure 135/90, pulse 95, temperature 98.8 F (37.1 C), temperature source Oral, resp. rate 18, height 5\' 9"  (1.753 m), weight 102 kg (224 lb 13.9 oz), SpO2 97.00%.   [place]/AT RRR, S1, S2 CTA bilaterally Soft NT, ND No edema  Labs:   Lab Results  Component Value Date   WBC 5.3 02/19/2012   HGB 13.7 02/19/2012   HCT 39.5 02/19/2012   MCV 90.2 02/19/2012   PLT 166 02/19/2012    Lab 02/19/12 0327 02/18/12 1538  NA 134* --  K 3.7 --  CL 96 --  CO2 26 --  BUN 17 --  CREATININE 1.13 --  CALCIUM 8.9 --  PROT -- 7.0  BILITOT -- 2.4*  ALKPHOS -- 37*  ALT -- 46  AST -- 38*  GLUCOSE 98 --   Lab Results  Component Value Date   CKTOTAL 133 02/19/2012   CKMB 2.3 02/19/2012   TROPONINI <0.30 02/19/2012    Lab Results  Component Value Date   CHOL 138 02/19/2012   Lab Results  Component Value Date   HDL 65 02/19/2012   Lab Results  Component Value  Date   LDLCALC 19 02/19/2012   Lab Results  Component Value Date   TRIG 271* 02/19/2012   Lab Results  Component Value Date   CHOLHDL 2.1 02/19/2012   No results found for this basename: LDLDIRECT      Radiology: no active cardiopulmonary disease EKG: NSR, no ST segment changes  FOLLOW UP PLANS AND APPOINTMENTS  Medication List  As of 02/19/2012  9:17 AM   TAKE these medications         amLODipine 5 MG tablet   Commonly known as: NORVASC   Take 1 tablet (5 mg total) by mouth daily.      aspirin 81 MG EC tablet   Take 1 tablet (81 mg total) by mouth daily.      hydroxypropyl methylcellulose 2.5 % ophthalmic solution   Commonly known as: ISOPTO TEARS   Place 1 drop into both eyes 3 (three) times daily as needed. For dry and/or red eyes.      lisinopril 40 MG tablet   Commonly known as: PRINIVIL,ZESTRIL   Take 40 mg by mouth daily.      LORazepam 0.5 MG tablet   Commonly known as: ATIVAN   Take 0.5 mg by mouth daily as  needed. For anxiety.      metoprolol succinate 25 MG 24 hr tablet   Commonly known as: TOPROL-XL   Take 25 mg by mouth daily.      pantoprazole 40 MG tablet   Commonly known as: PROTONIX   Take 1 tablet (40 mg total) by mouth daily at 12 noon.      simvastatin 40 MG tablet   Commonly known as: ZOCOR   Take 20 mg by mouth every evening.           Follow-up Information    Follow up with [name]., MD. (for stress test)    Contact information:   [address] [place] [address] [phone]          BRING ALL MEDICATIONS WITH YOU TO FOLLOW UP APPOINTMENTS  Time spent with patient to include physician time: 25 minutes Signed: [name] S. 02/19/2012, 9:17 AM

## 2012-02-19 NOTE — Discharge Instructions (Signed)
No food on the morning of the stress test.

## 2013-10-13 ENCOUNTER — Other Ambulatory Visit: Payer: Self-pay | Admitting: Interventional Cardiology

## 2013-11-08 ENCOUNTER — Other Ambulatory Visit: Payer: 59

## 2013-12-14 ENCOUNTER — Other Ambulatory Visit: Payer: Self-pay | Admitting: Interventional Cardiology

## 2014-03-14 ENCOUNTER — Encounter: Payer: Self-pay | Admitting: Cardiology

## 2014-03-14 DIAGNOSIS — E669 Obesity, unspecified: Secondary | ICD-10-CM

## 2014-03-15 ENCOUNTER — Other Ambulatory Visit: Payer: Self-pay | Admitting: Interventional Cardiology

## 2014-03-20 ENCOUNTER — Ambulatory Visit (INDEPENDENT_AMBULATORY_CARE_PROVIDER_SITE_OTHER): Payer: 59 | Admitting: Interventional Cardiology

## 2014-03-20 ENCOUNTER — Encounter: Payer: Self-pay | Admitting: Interventional Cardiology

## 2014-03-20 VITALS — BP 170/97 | HR 111 | Ht 69.0 in | Wt 231.0 lb

## 2014-03-20 DIAGNOSIS — I498 Other specified cardiac arrhythmias: Secondary | ICD-10-CM

## 2014-03-20 DIAGNOSIS — E669 Obesity, unspecified: Secondary | ICD-10-CM

## 2014-03-20 DIAGNOSIS — E7800 Pure hypercholesterolemia, unspecified: Secondary | ICD-10-CM

## 2014-03-20 DIAGNOSIS — I10 Essential (primary) hypertension: Secondary | ICD-10-CM

## 2014-03-20 DIAGNOSIS — I471 Supraventricular tachycardia: Secondary | ICD-10-CM

## 2014-03-20 MED ORDER — METOPROLOL SUCCINATE ER 25 MG PO TB24: 25.0000 mg | ORAL_TABLET | Freq: Every day | ORAL | Status: DC

## 2014-03-20 NOTE — Patient Instructions (Signed)
Your physician has recommended you make the following change in your medication:   1. Restart Metoprolol Succinate 25 mg daily.   Your physician recommends that you return for a FASTING lipid profile and hepatic panel at Dr. Fredirick MaudlinLittle's Office.  Your physician wants you to follow-up in: 1 year with Dr. Eldridge DaceVaranasi. You will receive a reminder letter in the mail two months in advance. If you don't receive a letter, please call our office to schedule the follow-up appointment.

## 2014-03-20 NOTE — Progress Notes (Signed)
Patient ID: Sean Booth, male   DOB: 27-Jun-1961, 53 y.o.   MRN: [id number]    [address] [place], [place]  [address] Phone: [phone]) [phone] Fax:  [phone](336) [phone]  Date:  03/20/2014   ID:  Sean Booth, DOB 27-Jun-1961, MRN [id number]  PCP:  Mickie HillierLITTLE,KEVIN LORNE, MD      History of Present Illness: Sean Booth is a 53 y.o. male who has had issues with alcohol and had tachycardia in the past. He had an SVT ablation in 2008. He had some tachycardia after. He was given a prescription for a beta blocker. He has been of of the beta blocker.  His PMD thought it was panic attacks/anxiety. He was on Zoloft and Xanax in the past.   He took a new job.   He has been exercising and feels well , up to 5 x/week. He has had stress at work with new job. He has stopped drinking and was going to [employer] meetings. He has restarted alcohol.  BP has increased in that time.  BP has bin the 130-150 range systolc. No chest pain or tightness.  Stress test in 2013 was normal     Wt Readings from Last 3 Encounters:  03/20/14 231 lb (104.781 kg)  02/18/12 224 lb 13.9 oz (102 kg)     Past Medical History  Diagnosis Date  . Hypertension   . Hypercholesteremia   . SVT (supraventricular tachycardia)   . Anxiety   . GERD (gastroesophageal reflux disease)     Current Outpatient Prescriptions  Medication Sig Dispense Refill  . lisinopril (PRINIVIL,ZESTRIL) 40 MG tablet TAKE 1 TABLET BY MOUTH DAILY  30 tablet  0  . simvastatin (ZOCOR) 40 MG tablet Take 20 mg by mouth every evening.      . metoprolol succinate (TOPROL-XL) 25 MG 24 hr tablet Take 1 tablet (25 mg total) by mouth daily.  30 tablet  11   No current facility-administered medications for this visit.    Allergies:   No Known Allergies  Social History:  The patient  reports that he has never smoked. He has never used smokeless tobacco. He reports that he drinks alcohol. He reports that he does not use illicit drugs.   Family History:  The  patient's family history includes Anxiety disorder in his mother.   ROS:  Please see the history of present illness.  No nausea, vomiting.  No fevers, chills.  No focal weakness.  No dysuria.    All other systems reviewed and negative.   PHYSICAL EXAM: VS:  BP 170/97  Pulse 111  Ht 5\' 9"  (1.753 m)  Wt 231 lb (104.781 kg)  BMI 34.10 kg/m2 Well nourished, well developed, in no acute distress HEENT: normal Neck: no JVD, no carotid bruits Cardiac:  normal S1, S2; tachycardia Lungs:  clear to auscultation bilaterally, no wheezing, rhonchi or rales Abd: soft, nontender, no hepatomegaly Ext: no edema Skin: warm and dry Neuro:   no focal abnormalities noted  EKG: sinus tachycardia, no ST segment changes    ASSESSMENT AND PLAN:  Unspecified cardiac dysrhythmia  Continue Metoprolol Succinate Tablet Extended Release 24 Hour, 25 MG, TAKE 1 TABLET ONCE A DAY PER DR VARANSI ORALLY Diagnostic Imaging:EKG Harward,Amy 10/24/2011 10:49:58 AM > VARANASI,JAY 10/24/2011 11:13:24 AM > normal  Symptoms under control. Status post ablation.  2. Mixed hyperlipidemia  Continue Simvastatin Tablet, 40 MG, 1/2 tablet, once every evening Lipids well-controlled in the past. We'll need a recheck.  He wants to do  this at Dr. Fredirick MaudlinLittle's office. 3. Essential hypertension, benign  Continue Lisinopril Tablet, 40 MG, TAKE 1 TABLET ONCE A DAY ORALLY Elevated in the doctor's office. Continue checking at home. Hopefully, with weight loss and exercise, blood pressure come down.  4. Obesity  we spoke about the importance of weight loss. He needs to exercise more. He has cut back on alcohol. This will also reduce his overall caloric intake.    Signed, Fredric MareJay S. Varanasi, MD, [facility] 03/20/2014 3:43 PM

## 2014-04-12 ENCOUNTER — Other Ambulatory Visit: Payer: Self-pay | Admitting: Interventional Cardiology

## 2014-04-13 ENCOUNTER — Other Ambulatory Visit: Payer: Self-pay

## 2014-09-01 ENCOUNTER — Telehealth: Payer: Self-pay | Admitting: Interventional Cardiology

## 2014-09-01 DIAGNOSIS — E7800 Pure hypercholesterolemia, unspecified: Secondary | ICD-10-CM

## 2014-09-01 NOTE — Telephone Encounter (Signed)
Patient was suppose to have labs done at Dr. Fredirick MaudlinLittle's office in July 2015, according to office visit with Dr. Eldridge DaceVaranasi of 03/20/14. Patient stated "I kind of got lazy on this one and I didn't go right away. I tried to get labs done at Dr. Fredirick MaudlinLittle's office a couple of weeks ago and there was no order." Patient informed me that it has been two weeks since he has had his Simvastatin, because the pharmacy will not fill unless he has blood work done. Patient is willing to get labs done as soon as possible. Will forward to Dr. Eldridge DaceVaranasi for request for lab orders.

## 2014-09-01 NOTE — Telephone Encounter (Signed)
CMet and lipids need to be drawn

## 2014-09-01 NOTE — Telephone Encounter (Signed)
New message    Need to discuss with the nurse his cholesterol medicine renewal.     Medication won't be refill until lab work is done.

## 2014-09-01 NOTE — Telephone Encounter (Signed)
Returned patient's call. Patient having labs drawn on 09/04/14. CMET and fasting lipid ordered. Patient verbalized understanding.

## 2014-09-04 ENCOUNTER — Other Ambulatory Visit (INDEPENDENT_AMBULATORY_CARE_PROVIDER_SITE_OTHER): Payer: 59 | Admitting: *Deleted

## 2014-09-04 DIAGNOSIS — E7800 Pure hypercholesterolemia, unspecified: Secondary | ICD-10-CM

## 2014-09-04 DIAGNOSIS — I10 Essential (primary) hypertension: Secondary | ICD-10-CM

## 2014-09-04 LAB — LIPID PANEL
CHOL/HDL RATIO: 8
Cholesterol: 235 mg/dL — ABNORMAL HIGH (ref 0–200)
HDL: 29.9 mg/dL — AB (ref >39.00)
LDL Cholesterol: 184 mg/dL — ABNORMAL HIGH (ref 0–99)
NONHDL: 205.1
Triglycerides: 105 mg/dL (ref 0.0–149.0)
VLDL: 21 mg/dL (ref 0.0–40.0)

## 2014-09-04 LAB — HEPATIC FUNCTION PANEL
ALT: 22 U/L (ref 0–53)
AST: 18 U/L (ref 0–37)
Albumin: 4.4 g/dL (ref 3.5–5.2)
Alkaline Phosphatase: 38 U/L — ABNORMAL LOW (ref 39–117)
BILIRUBIN DIRECT: 0 mg/dL (ref 0.0–0.3)
BILIRUBIN TOTAL: 0.9 mg/dL (ref 0.2–1.2)
TOTAL PROTEIN: 7 g/dL (ref 6.0–8.3)

## 2014-09-04 LAB — BASIC METABOLIC PANEL
BUN: 21 mg/dL (ref 6–23)
CHLORIDE: 101 meq/L (ref 96–112)
CO2: 25 meq/L (ref 19–32)
CREATININE: 1.1 mg/dL (ref 0.4–1.5)
Calcium: 9.1 mg/dL (ref 8.4–10.5)
GFR: 71.26 mL/min (ref >60.00)
Glucose, Bld: 102 mg/dL — ABNORMAL HIGH (ref 70–99)
POTASSIUM: 4.2 meq/L (ref 3.5–5.1)
Sodium: 134 mEq/L — ABNORMAL LOW (ref 135–145)

## 2014-09-04 NOTE — Addendum Note (Signed)
Addended by: Tonita PhoenixBOWDEN, Mckennah Kretchmer K on: 09/04/2014 09:14 AM   Modules accepted: Orders

## 2014-09-21 ENCOUNTER — Encounter: Payer: Self-pay | Admitting: *Deleted

## 2014-09-21 ENCOUNTER — Other Ambulatory Visit: Payer: Self-pay

## 2014-09-21 ENCOUNTER — Telehealth: Payer: Self-pay

## 2014-09-21 ENCOUNTER — Other Ambulatory Visit: Payer: Self-pay | Admitting: *Deleted

## 2014-09-21 DIAGNOSIS — E782 Mixed hyperlipidemia: Secondary | ICD-10-CM

## 2014-09-21 MED ORDER — SIMVASTATIN 20 MG PO TABS: 20.0000 mg | ORAL_TABLET | Freq: Every day | ORAL | Status: DC

## 2014-09-21 MED ORDER — SIMVASTATIN 40 MG PO TABS: 20.0000 mg | ORAL_TABLET | Freq: Every evening | ORAL | Status: DC

## 2014-09-21 NOTE — Telephone Encounter (Signed)
Patient called about getting back on simvastatin 20 mg I read a note in the lab section the he could go back on simvastatin 20 mg. Patient verbalized understanding

## 2014-09-22 NOTE — Telephone Encounter (Signed)
Lipid/ liver was ordered. Labs scheduled for 12/21/14. Letter mailed to the patient.

## 2014-10-20 ENCOUNTER — Other Ambulatory Visit: Payer: Self-pay

## 2014-10-20 MED ORDER — SIMVASTATIN 20 MG PO TABS: 20.0000 mg | ORAL_TABLET | Freq: Every day | ORAL | Status: DC

## 2014-12-21 ENCOUNTER — Other Ambulatory Visit: Payer: Self-pay

## 2015-01-24 ENCOUNTER — Other Ambulatory Visit: Payer: Self-pay | Admitting: Interventional Cardiology

## 2015-01-24 NOTE — Telephone Encounter (Signed)
Pt was called, he missed his lab draw 12/21/14  to check liver and lipds since restart of zocor. Pt states he will come tomorrow, app made, med refilled for 3 months.

## 2015-01-25 ENCOUNTER — Other Ambulatory Visit (INDEPENDENT_AMBULATORY_CARE_PROVIDER_SITE_OTHER): Payer: BC Managed Care – PPO

## 2015-01-25 DIAGNOSIS — E782 Mixed hyperlipidemia: Secondary | ICD-10-CM

## 2015-01-25 LAB — HEPATIC FUNCTION PANEL
ALT: 25 U/L (ref 0–53)
AST: 16 U/L (ref 0–37)
Albumin: 4.6 g/dL (ref 3.5–5.2)
Alkaline Phosphatase: 40 U/L (ref 39–117)
BILIRUBIN DIRECT: 0.2 mg/dL (ref 0.0–0.3)
Total Bilirubin: 0.8 mg/dL (ref 0.2–1.2)
Total Protein: 7.3 g/dL (ref 6.0–8.3)

## 2015-01-25 LAB — LIPID PANEL
CHOL/HDL RATIO: 4
Cholesterol: 169 mg/dL (ref 0–200)
HDL: 39.8 mg/dL (ref >39.00)
LDL CALC: 114 mg/dL — AB (ref 0–99)
NONHDL: 129.2
TRIGLYCERIDES: 77 mg/dL (ref 0.0–149.0)
VLDL: 15.4 mg/dL (ref 0.0–40.0)

## 2015-02-20 ENCOUNTER — Other Ambulatory Visit: Payer: Self-pay | Admitting: Interventional Cardiology

## 2015-03-17 ENCOUNTER — Other Ambulatory Visit: Payer: Self-pay | Admitting: Interventional Cardiology

## 2015-03-24 ENCOUNTER — Other Ambulatory Visit: Payer: Self-pay | Admitting: Interventional Cardiology

## 2015-04-26 ENCOUNTER — Other Ambulatory Visit: Payer: Self-pay | Admitting: Interventional Cardiology

## 2015-05-18 ENCOUNTER — Other Ambulatory Visit: Payer: Self-pay | Admitting: Interventional Cardiology

## 2015-05-28 ENCOUNTER — Other Ambulatory Visit: Payer: Self-pay | Admitting: Interventional Cardiology

## 2015-06-04 ENCOUNTER — Other Ambulatory Visit: Payer: Self-pay

## 2015-06-04 MED ORDER — LISINOPRIL 40 MG PO TABS: ORAL_TABLET | ORAL | Status: DC

## 2015-06-04 NOTE — Telephone Encounter (Signed)
Sean Crafts, MD at 03/20/2014 3:19 PM  lisinopril (PRINIVIL,ZESTRIL) 40 MG tabletTAKE 1 TABLET BY MOUTH DAILY 3. Essential hypertension, benign  Continue Lisinopril Tablet, 40 MG, TAKE 1 TABLET ONCE A DAY ORALLY Elevated in the doctor's office. Continue checking at home. Hopefully, with weight loss and exercise, blood pressure come down

## 2015-06-29 ENCOUNTER — Other Ambulatory Visit: Payer: Self-pay | Admitting: Interventional Cardiology

## 2015-06-29 NOTE — Telephone Encounter (Signed)
Ok to give 30 day supply. Thanks.

## 2015-07-03 ENCOUNTER — Ambulatory Visit (INDEPENDENT_AMBULATORY_CARE_PROVIDER_SITE_OTHER): Payer: BC Managed Care – PPO | Admitting: Interventional Cardiology

## 2015-07-03 ENCOUNTER — Encounter: Payer: Self-pay | Admitting: Interventional Cardiology

## 2015-07-03 ENCOUNTER — Other Ambulatory Visit: Payer: Self-pay | Admitting: Interventional Cardiology

## 2015-07-03 VITALS — BP 130/80 | HR 64 | Ht 69.0 in | Wt 234.0 lb

## 2015-07-03 DIAGNOSIS — E7800 Pure hypercholesterolemia, unspecified: Secondary | ICD-10-CM

## 2015-07-03 DIAGNOSIS — I471 Supraventricular tachycardia: Secondary | ICD-10-CM

## 2015-07-03 DIAGNOSIS — I10 Essential (primary) hypertension: Secondary | ICD-10-CM | POA: Diagnosis not present

## 2015-07-03 DIAGNOSIS — E669 Obesity, unspecified: Secondary | ICD-10-CM | POA: Diagnosis not present

## 2015-07-03 LAB — COMPREHENSIVE METABOLIC PANEL
ALT: 26 U/L (ref 9–46)
AST: 19 U/L (ref 10–35)
Albumin: 4.6 g/dL (ref 3.6–5.1)
Alkaline Phosphatase: 35 U/L — ABNORMAL LOW (ref 40–115)
BUN: 17 mg/dL (ref 7–25)
CHLORIDE: 100 mmol/L (ref 98–110)
CO2: 26 mmol/L (ref 20–31)
Calcium: 9.2 mg/dL (ref 8.6–10.3)
Creat: 1.21 mg/dL (ref 0.70–1.33)
Glucose, Bld: 97 mg/dL (ref 65–99)
POTASSIUM: 4.4 mmol/L (ref 3.5–5.3)
Sodium: 135 mmol/L (ref 135–146)
TOTAL PROTEIN: 7 g/dL (ref 6.1–8.1)
Total Bilirubin: 1 mg/dL (ref 0.2–1.2)

## 2015-07-03 LAB — LIPID PANEL
Cholesterol: 158 mg/dL (ref 125–200)
HDL: 33 mg/dL — AB (ref >=40)
LDL CALC: 106 mg/dL (ref <130)
Total CHOL/HDL Ratio: 4.8 Ratio (ref <=5.0)
Triglycerides: 93 mg/dL (ref <150)
VLDL: 19 mg/dL (ref <30)

## 2015-07-03 NOTE — Patient Instructions (Signed)
**Note De-Identified Daryus Sowash Obfuscation** Medication Instructions:  Same-no changes  Labwork: Today (Lipids and CMET)  Testing/Procedures: None  Follow-Up: Your physician wants you to follow-up in: 1 year. You will receive a reminder letter in the mail two months in advance. If you don't receive a letter, please call our office to schedule the follow-up appointment.

## 2015-07-03 NOTE — Progress Notes (Signed)
Patient ID: Sean Booth, male   DOB: [birth year], 54 y.o.   MRN: [id number]     Cardiology Office Note   Date:  07/03/2015   ID:  Sean Booth, DOB 1961/04/22, MRN [id number]  PCP:  Gennette Pac, MD    No chief complaint on file. f/u SVT   Wt Readings from Last 3 Encounters:  07/03/15 234 lb (106.142 kg)  03/20/14 231 lb (104.781 kg)  02/18/12 224 lb 13.9 oz (102 kg)       History of Present Illness: Sean Booth is a 54 y.o. male  who has had issues with alcohol and had tachycardia in the past. He had an SVT ablation in 2008. He had some tachycardia after. He was given a prescription for a beta blocker. He has been of of the beta blocker. His PMD thought it was panic attacks/anxiety. He was on Zoloft and Xanax in the past.   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 He has been exercising and feels well, up to 5-6 x/week, 30-40 min/day, rides a bike, lifts weights. He has had less stress at work with the current job. He has stopped drinking and was going to [employer]. He has restarted moderate alcohol. BP has increased in that time. BP has been the 546-568 range systolic in the past, but he has not been checking it recently.  He feels different when the BP is increased. No chest pain or tightness.   He has trouble losing weight.  No palpitations.     Past Medical History  Diagnosis Date  . Hypertension   . Hypercholesteremia   . SVT (supraventricular tachycardia) ([place])   . Anxiety   . GERD (gastroesophageal reflux disease)     Past Surgical History  Procedure Laterality Date  . Radiofrequency ablation      for SVT (01/2011)  . Colonoscopy       Current Outpatient Prescriptions  Medication Sig Dispense Refill  . lisinopril (PRINIVIL,ZESTRIL) 40 MG tablet TAKE 1 TABLET BY MOUTH DAILY 30 tablet 0  . metoprolol succinate (TOPROL-XL) 25 MG 24 hr tablet TAKE 1 TABLET (25 MG TOTAL) BY MOUTH DAILY. 30 tablet 0  . simvastatin (ZOCOR) 20 MG tablet TAKE  1 TABLET (20 MG TOTAL) BY MOUTH DAILY AT 6 PM. 30 tablet 2   No current facility-administered medications for this visit.    Allergies:   Review of patient's allergies indicates no known allergies.    Social History:  The patient  reports that he has never smoked. He has never used smokeless tobacco. He reports that he drinks alcohol. He reports that he does not use illicit drugs.   Family History:  The patient's family history includes Anxiety disorder in his mother; Heart attack in his father and maternal grandfather; Hypertension in his mother. There is no history of Stroke.    ROS:  Please see the history of present illness.   Otherwise, review of systems are positive for difficulty losing weight; rare GERD.   All other systems are reviewed and negative.    PHYSICAL EXAM: VS:  BP 130/80 mmHg  Pulse 64  Ht _0  (1.753 m)  Wt 234 lb (106.142 kg)  BMI 34.54 kg/m2 , BMI Body mass index is 34.54 kg/(m^2). GEN: Well nourished, well developed, in no acute distress HEENT: normal Neck: no JVD, carotid bruits, or masses Cardiac: RRR; no murmurs, rubs, or gallops,no edema  Respiratory:  clear to auscultation bilaterally, normal work of breathing GI: soft, nontender, nondistended, +  BS MS: no deformity or atrophy Skin: warm and dry, no rash Neuro:  Strength and sensation are intact Psych: euthymic mood, full affect   EKG:   The ekg ordered today demonstrates normal ECG   Recent Labs: 09/04/2014: BUN 21; Creatinine, Ser 1.1; Potassium 4.2; Sodium 134* 01/25/2015: ALT 25   Lipid Panel    Component Value Date/Time   CHOL 169 01/25/2015 1114   TRIG 77.0 01/25/2015 1114   HDL 39.80 01/25/2015 1114   CHOLHDL 4 01/25/2015 1114   VLDL 15.4 01/25/2015 1114   LDLCALC 114* 01/25/2015 1114     Other studies Reviewed: Additional studies/ records that were reviewed today with results demonstrating: normal ECG.   ASSESSMENT AND PLAN:  1. SVT: Status post ablation. Continue  metoprolol. Symptoms under control. 2. Hyperlipidemia: Check lipids today. Continue simvastatin. They were much better controlled in May 2016. 3. Hypertension: Continue lisinopril. Check electrolytes today. Continue regular exercise and lifestyle modifications. 4. Obesity: He has had difficulty losing weight. He has not gained significant weight. Continue diet control. Continue abstaining from alcohol as much as possible. He is back to moderate alcohol use. Minimize alcohol and help reduce caloric intake and help with weight loss.   Current medicines are reviewed at length with the patient today.  The patient concerns regarding his medicines were addressed.  The following changes have been made:  No change  Labs/ tests ordered today include:   Orders Placed This Encounter  Procedures  . Comp Met (CMET)  . Lipid Profile  . EKG 12-Lead    Recommend 150 minutes/week of aerobic exercise Low fat, low carb, high fiber diet recommended  Disposition:   FU in 1 year   Teresita Madura., MD  07/03/2015 11:38 AM    [facility] [place], [place], [place]  [address] Phone: [phone]; Fax: [phone]

## 2015-07-31 ENCOUNTER — Other Ambulatory Visit: Payer: Self-pay | Admitting: Interventional Cardiology

## 2015-08-01 ENCOUNTER — Other Ambulatory Visit: Payer: Self-pay | Admitting: Interventional Cardiology

## 2015-09-22 ENCOUNTER — Emergency Department (HOSPITAL_COMMUNITY)
Admission: EM | Admit: 2015-09-22 | Discharge: 2015-09-22 | Disposition: A | Discharge status: Home / Self Care | Payer: BC Managed Care – PPO | Attending: Emergency Medicine | Admitting: Emergency Medicine

## 2015-09-22 ENCOUNTER — Encounter (HOSPITAL_COMMUNITY): Payer: Self-pay | Admitting: Emergency Medicine

## 2015-09-22 ENCOUNTER — Emergency Department (HOSPITAL_COMMUNITY): Payer: BC Managed Care – PPO

## 2015-09-22 DIAGNOSIS — Z79899 Other long term (current) drug therapy: Secondary | ICD-10-CM | POA: Diagnosis not present

## 2015-09-22 DIAGNOSIS — S8392XA Sprain of unspecified site of left knee, initial encounter: Secondary | ICD-10-CM | POA: Diagnosis not present

## 2015-09-22 DIAGNOSIS — Y9389 Activity, other specified: Secondary | ICD-10-CM | POA: Insufficient documentation

## 2015-09-22 DIAGNOSIS — Z8659 Personal history of other mental and behavioral disorders: Secondary | ICD-10-CM | POA: Diagnosis not present

## 2015-09-22 DIAGNOSIS — S8992XA Unspecified injury of left lower leg, initial encounter: Secondary | ICD-10-CM | POA: Diagnosis present

## 2015-09-22 DIAGNOSIS — E7800 Pure hypercholesterolemia, unspecified: Secondary | ICD-10-CM | POA: Diagnosis not present

## 2015-09-22 DIAGNOSIS — Y9289 Other specified places as the place of occurrence of the external cause: Secondary | ICD-10-CM | POA: Insufficient documentation

## 2015-09-22 DIAGNOSIS — W009XXA Unspecified fall due to ice and snow, initial encounter: Secondary | ICD-10-CM | POA: Insufficient documentation

## 2015-09-22 DIAGNOSIS — I10 Essential (primary) hypertension: Secondary | ICD-10-CM | POA: Insufficient documentation

## 2015-09-22 DIAGNOSIS — Z8719 Personal history of other diseases of the digestive system: Secondary | ICD-10-CM | POA: Diagnosis not present

## 2015-09-22 DIAGNOSIS — Y998 Other external cause status: Secondary | ICD-10-CM | POA: Insufficient documentation

## 2015-09-22 MED ORDER — IBUPROFEN 600 MG PO TABS: 600.0000 mg | ORAL_TABLET | Freq: Four times a day (QID) | ORAL | Status: DC | PRN

## 2015-09-22 MED ORDER — HYDROCODONE-ACETAMINOPHEN 5-325 MG PO TABS: 2.0000 | ORAL_TABLET | ORAL | Status: DC | PRN

## 2015-09-22 NOTE — ED Notes (Signed)
Per pt, states he fell and heard left knee pop-now unable to move it

## 2015-09-22 NOTE — ED Provider Notes (Signed)
CSN: [id number]     Arrival date & time 09/22/15  1532 History   First MD Initiated Contact with Patient 09/22/15 1613     Chief Complaint  Patient presents with  . Knee Pain     (Consider location/radiation/quality/duration/timing/severity/associated sxs/prior Treatment) Patient is a 55 y.o. male presenting with knee pain. No language interpreter was used.  Knee Pain Location:  Knee Time since incident:  2 hours Injury: no   Knee location:  L knee Pain details:    Quality:  Aching   Radiates to:  Does not radiate   Severity:  Moderate   Onset quality:  Gradual   Timing:  Constant   Progression:  Unchanged Chronicity:  New Foreign body present:  No foreign bodies Relieved by:  Nothing Worsened by:  Bearing weight, extension and adduction Ineffective treatments:  None tried Associated symptoms: swelling   Risk factors: no concern for non-accidental trauma   Pt slipped on ice and injured knee.  Pt complains of swelling and pain.  Pt has pain with movement and difficulty extending  Past Medical History  Diagnosis Date  . Hypertension   . Hypercholesteremia   . SVT (supraventricular tachycardia) ([facility])   . Anxiety   . GERD (gastroesophageal reflux disease)    Past Surgical History  Procedure Laterality Date  . Radiofrequency ablation      for SVT (01/2011)  . Colonoscopy     Family History  Problem Relation Age of Onset  . Anxiety disorder Mother   . Heart attack Father   . Heart attack Maternal Grandfather   . Hypertension Mother   . Stroke Neg Hx    Social History  Substance Use Topics  . Smoking status: Never Smoker   . Smokeless tobacco: Never Used  . Alcohol Use: Yes     Comment: heavy drinker    Review of Systems  All other systems reviewed and are negative.     Allergies  Review of patient's allergies indicates no known allergies.  Home Medications   Prior to Admission medications   Medication Sig Start Date End Date Taking? Authorizing  Provider  lisinopril (PRINIVIL,ZESTRIL) 40 MG tablet Take 1 tablet (40 mg total) by mouth daily. 07/03/15   [name], MD  metoprolol succinate (TOPROL-XL) 25 MG 24 hr tablet TAKE 1 TABLET (25 MG TOTAL) BY MOUTH DAILY. 03/26/15   [name], MD  metoprolol succinate (TOPROL-XL) 25 MG 24 hr tablet TAKE 1 TABLET (25 MG TOTAL) BY MOUTH DAILY. 08/01/15   [name], MD  simvastatin (ZOCOR) 20 MG tablet TAKE 1 TABLET (20 MG TOTAL) BY MOUTH DAILY AT 6 PM. 01/24/15   [name], MD  simvastatin (ZOCOR) 20 MG tablet TAKE 1 TABLET (20 MG TOTAL) BY MOUTH DAILY AT 6 PM. 08/02/15   [name], MD   BP 141/77 mmHg  Pulse 93  Temp(Src) 97.6 F (36.4 C) (Oral)  Resp 16  SpO2 99% Physical Exam  Constitutional: He appears well-developed and well-nourished.  Cardiovascular: Normal rate.   Musculoskeletal: He exhibits tenderness.  Effusion left knee,  Pain with movement,  Decreased range of motion,  No gross instability,  nv and ns intact  Neurological: He is alert.  Skin: Skin is warm.  Nursing note and vitals reviewed.   ED Course  Procedures (including critical care time) Labs Review Labs Reviewed - No data to display  Imaging Review Dg Knee Complete 4 Views Left  09/22/2015  CLINICAL DATA:  Fall, heard  left knee pop. Limited movement in left knee. EXAM: LEFT KNEE - COMPLETE 4+ VIEW COMPARISON:  None. FINDINGS: There is no evidence of fracture, dislocation, or joint effusion. There is no evidence of significant arthropathy or other focal bone abnormality. Soft tissues are unremarkable. IMPRESSION: Negative. Electronically Signed   By: Bary RichardStan  Maynard M.D.   On: 09/22/2015 16:21   I have personally reviewed and evaluated these images and lab results as part of my medical decision-making.   EKG Interpretation None      MDM  Pt counseled on results.  Pt advised to schedule to see the Orthopaedist for evaluation.  Pt placed in a knee immobilizer and  crutches.  Rx for hydrocodone and ibuprofen    Final diagnoses:  Knee sprain, left, initial encounter        Elson AreasLeslie K Eulogia Dismore, PA-C 09/22/15 1701  Loren Raceravid Yelverton, MD 09/22/15 [phone]

## 2015-09-22 NOTE — Discharge Instructions (Signed)

## 2016-06-29 ENCOUNTER — Other Ambulatory Visit: Payer: Self-pay | Admitting: Interventional Cardiology

## 2016-07-14 ENCOUNTER — Other Ambulatory Visit: Payer: Self-pay | Admitting: Interventional Cardiology

## 2016-07-21 NOTE — Progress Notes (Signed)
Patient ID: Sean Booth, male   DOB: September 06, 1961, 55 y.o.   MRN: [id number]     Cardiology Office Note   Date:  07/22/2016   ID:  Sean Booth, DOB September 06, 1961, MRN [id number]  PCP:  Sean Booth,Sean LORNE, Sean Booth    Chief Complaint  Patient presents with  . essential hypertension  f/u SVT   Wt Readings from Last 3 Encounters:  07/22/16 106.1 kg (233 lb 12.8 oz)  07/03/15 106.1 kg (234 lb)  03/20/14 104.8 kg (231 lb)       History of Present Illness: Sean SavannahGerald Cacioppo is a 55 y.o. male  who has had issues with alcohol and had tachycardia in the past. He had an SVT ablation in 2008. He had some tachycardia after. He was given a prescription for a beta blocker. He has been of of the beta blocker. His PMD thought it was panic attacks/anxiety. He was on Zoloft and Xanax in the past.   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  He works for the [employer] ([employer]   He has been exercising and feels well, up to 5-6 x/week, 30-40 min/day, rides a bike, lifts weights.  He walks sometimes as well.  He has had less stress at work with the current job. He has stopped drinking and was going to [employer] meetings. He has restarted moderate alcohol.He now only rarely drinks.  BP has been the 130-150 range systolic in the past, but he has not been checking it recently.  He feels different when the BP is increased. No chest pain or tightness.  No recent sx like when his BP is increased.    He has trouble losing weight.  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  This slowed down his exercise.    No palpitations.  He has a BP cuff at home.       Past Medical History:  Diagnosis Date  . Anxiety   . GERD (gastroesophageal reflux disease)   . Hypercholesteremia   . Hypertension   . SVT (supraventricular tachycardia) (HCC)     Past Surgical History:  Procedure Laterality Date  . COLONOSCOPY    . RADIOFREQUENCY ABLATION     for SVT (01/2011)      Current Outpatient Prescriptions  Medication Sig Dispense Refill  . HYDROcodone-acetaminophen (NORCO/VICODIN) 5-325 MG tablet Take 2 tablets by mouth every 4 (four) hours as needed. 16 tablet 0  . ibuprofen (ADVIL,MOTRIN) 600 MG tablet Take 1 tablet (600 mg total) by mouth every 6 (six) hours as needed. 30 tablet 0  . lisinopril (PRINIVIL,ZESTRIL) 40 MG tablet TAKE 1 TABLET BY MOUTH DAILY 30 tablet 0  . metoprolol succinate (TOPROL-XL) 25 MG 24 hr tablet TAKE 1 TABLET (25 MG TOTAL) BY MOUTH DAILY. 30 tablet 0  . simvastatin (ZOCOR) 20 MG tablet TAKE 1 TABLET (20 MG TOTAL) BY MOUTH DAILY AT 6 PM. 30 tablet 2   No current facility-administered medications for this visit.     Allergies:   Patient has no known allergies.    Social History:  The patient  reports that he has never smoked. He has never used smokeless tobacco. He reports that he drinks alcohol. He reports that he does not use drugs.   Family History:  The patient's family history includes Anxiety disorder in his mother; Heart attack in his father and maternal grandfather; Hypertension in his mother.    ROS:  Please see the history of  present illness.   Otherwise, review of systems are positive for difficulty losing weight; rare GERD.   All other systems are reviewed and negative.    PHYSICAL EXAM: VS:  BP (!) 160/72   Pulse 90   Ht 5\' 9"  (1.753 m)   Wt 106.1 kg (233 lb 12.8 oz)   BMI 34.53 kg/m  , BMI Body mass index is 34.53 kg/m. GEN: Well nourished, well developed, in no acute distress  HEENT: normal  Neck: no JVD, carotid bruits, or masses Cardiac: RRR; no murmurs, rubs, or gallops,no edema  Respiratory:  clear to auscultation bilaterally, normal work of breathing GI: soft, nontender, nondistended, + BS MS: no deformity or atrophy  Skin: warm and dry, no rash Neuro:  Strength and sensation are intact Psych: euthymic mood, full affect   EKG:   The ekg ordered today demonstrates NSR, no ST segment  changes, isolated Q in lead III   Recent Labs: No results found for requested labs within last 8760 hours.   Lipid Panel    Component Value Date/Time   CHOL 158 07/03/2015 1134   TRIG 93 07/03/2015 1134   HDL 33 (L) 07/03/2015 1134   CHOLHDL 4.8 07/03/2015 1134   VLDL 19 07/03/2015 1134   LDLCALC 106 07/03/2015 1134     Other studies Reviewed: Additional studies/ records that were reviewed today with results demonstrating: normal ECG.   ASSESSMENT AND PLAN:  1. SVT: Status post ablation. Continue metoprolol. Symptoms under control. 2. Hyperlipidemia: Check lipids toliver tests in the next few weeks. Continue simvastatin. They were much better controlled in May 2016. 3. Hypertension: Continue lisinopril. Check electrolytes today. Continue regular exercise and lifestyle modifications.  BP recheck 124/68 today by me with larger cuff. 4. Obesity: He has had difficulty losing weight. He has not gained significant weight. Continue diet control. Continue abstaining from alcohol as much as possible. He is back to rare alcohol use. Minimize alcohol and help reduce caloric intake and help with weight loss.  Continue exercise.    Current medicines are reviewed at length with the patient today.  The patient concerns regarding his medicines were addressed.  The following changes have been made:  No change  Labs/ tests ordered today include:   No orders of the defined types were placed in this encounter.   Recommend 150 minutes/week of aerobic exercise Low fat, low carb, high fiber diet recommended  Disposition:   FU in 1 year   Signed, Lance MussJayadeep Imari Reen, Sean Booth  07/22/2016 1:31 PM    [facility] [address], [place], [place]  [address] Phone: [phone]; Fax: [phone]

## 2016-07-22 ENCOUNTER — Ambulatory Visit (INDEPENDENT_AMBULATORY_CARE_PROVIDER_SITE_OTHER): Payer: BC Managed Care – PPO | Admitting: Interventional Cardiology

## 2016-07-22 ENCOUNTER — Encounter: Payer: Self-pay | Admitting: Interventional Cardiology

## 2016-07-22 ENCOUNTER — Encounter (INDEPENDENT_AMBULATORY_CARE_PROVIDER_SITE_OTHER): Payer: Self-pay

## 2016-07-22 VITALS — BP 160/72 | HR 90 | Ht 69.0 in | Wt 233.8 lb

## 2016-07-22 DIAGNOSIS — I471 Supraventricular tachycardia, unspecified: Secondary | ICD-10-CM

## 2016-07-22 DIAGNOSIS — I10 Essential (primary) hypertension: Secondary | ICD-10-CM | POA: Diagnosis not present

## 2016-07-22 DIAGNOSIS — E7800 Pure hypercholesterolemia, unspecified: Secondary | ICD-10-CM | POA: Diagnosis not present

## 2016-07-22 DIAGNOSIS — Z6834 Body mass index (BMI) 34.0-34.9, adult: Secondary | ICD-10-CM

## 2016-07-22 DIAGNOSIS — E6609 Other obesity due to excess calories: Secondary | ICD-10-CM

## 2016-07-22 NOTE — Patient Instructions (Signed)
**Note De-Identified [facility] Obfuscation** Medication Instructions:  Same-no changes  Labwork: Anytime on July 29, 2016 between 7:30 and 5:00. Please do not eat or drink after midnight the night before labs are drawn.  Testing/Procedures: None  Follow-Up: Your physician wants you to follow-up in: 1 year. You will receive a reminder letter in the mail two months in advance. If you don't receive a letter, please call our office to schedule the follow-up appointment.     If you need a refill on your cardiac medications before your next appointment, please call your pharmacy.

## 2016-07-29 ENCOUNTER — Other Ambulatory Visit: Payer: BC Managed Care – PPO | Admitting: *Deleted

## 2016-07-29 DIAGNOSIS — E7800 Pure hypercholesterolemia, unspecified: Secondary | ICD-10-CM

## 2016-07-29 LAB — LIPID PANEL
Cholesterol: 171 mg/dL (ref <200)
HDL: 34 mg/dL — ABNORMAL LOW (ref >40)
LDL CALC: 119 mg/dL — AB (ref <100)
Total CHOL/HDL Ratio: 5 Ratio — ABNORMAL HIGH (ref <5.0)
Triglycerides: 88 mg/dL (ref <150)
VLDL: 18 mg/dL (ref <30)

## 2016-07-29 LAB — COMPREHENSIVE METABOLIC PANEL
ALT: 22 U/L (ref 9–46)
AST: 17 U/L (ref 10–35)
Albumin: 4.6 g/dL (ref 3.6–5.1)
Alkaline Phosphatase: 33 U/L — ABNORMAL LOW (ref 40–115)
BILIRUBIN TOTAL: 0.8 mg/dL (ref 0.2–1.2)
BUN: 16 mg/dL (ref 7–25)
CO2: 23 mmol/L (ref 20–31)
CREATININE: 1.23 mg/dL (ref 0.70–1.33)
Calcium: 9.3 mg/dL (ref 8.6–10.3)
Chloride: 102 mmol/L (ref 98–110)
GLUCOSE: 104 mg/dL — AB (ref 65–99)
Potassium: 4.4 mmol/L (ref 3.5–5.3)
Sodium: 137 mmol/L (ref 135–146)
TOTAL PROTEIN: 6.6 g/dL (ref 6.1–8.1)

## 2016-07-31 ENCOUNTER — Other Ambulatory Visit: Payer: Self-pay | Admitting: Interventional Cardiology

## 2017-04-24 ENCOUNTER — Other Ambulatory Visit: Payer: Self-pay | Admitting: Interventional Cardiology

## 2017-06-09 IMAGING — DX DG KNEE COMPLETE 4+V*L*
4 series · 4 of 4 positions shown · non-contrast
Comparison: None.

CLINICAL DATA: Fall, heard left knee pop. Limited movement in left
knee.

EXAM:
LEFT KNEE - COMPLETE 4+ VIEW

[knee ap]
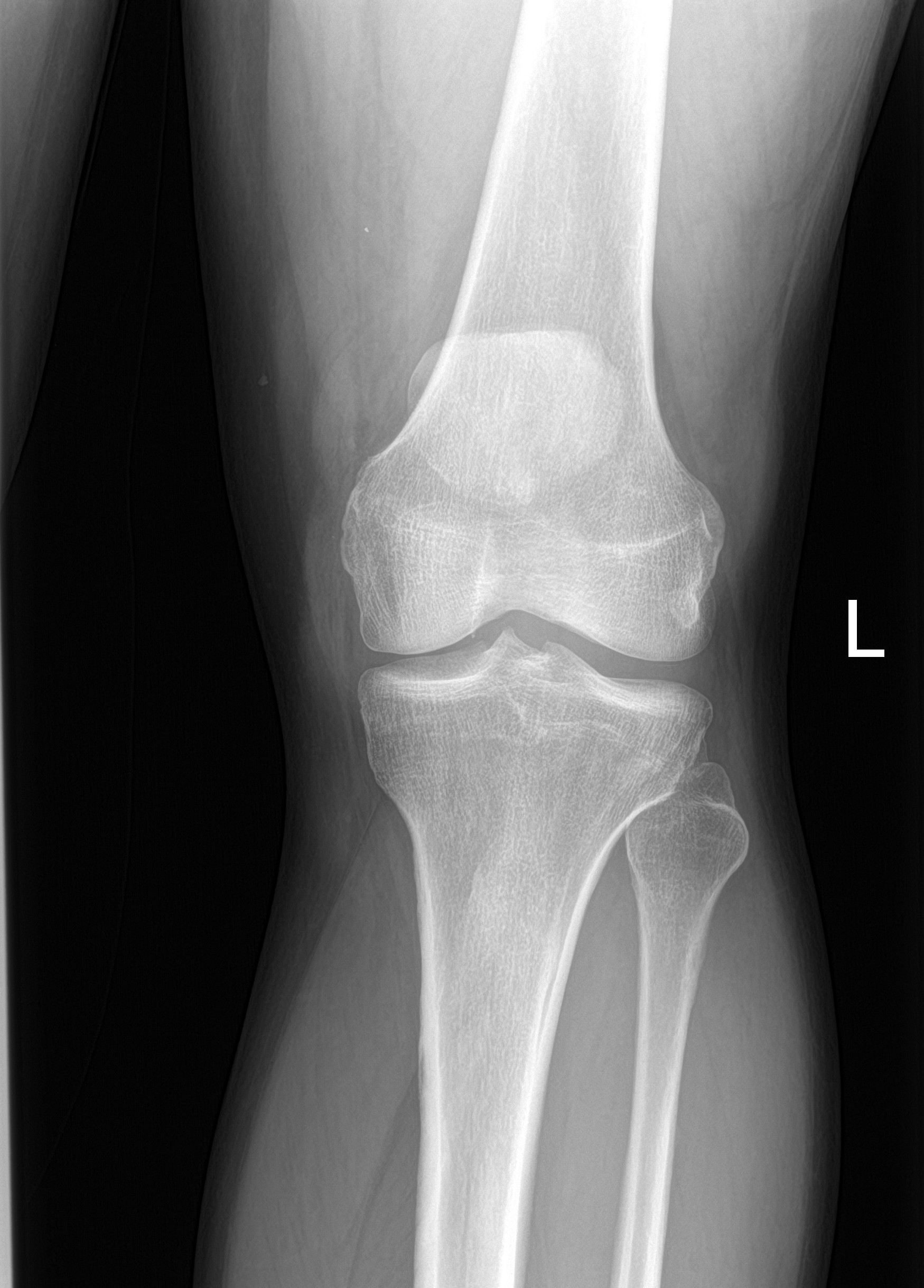

[knee lat]
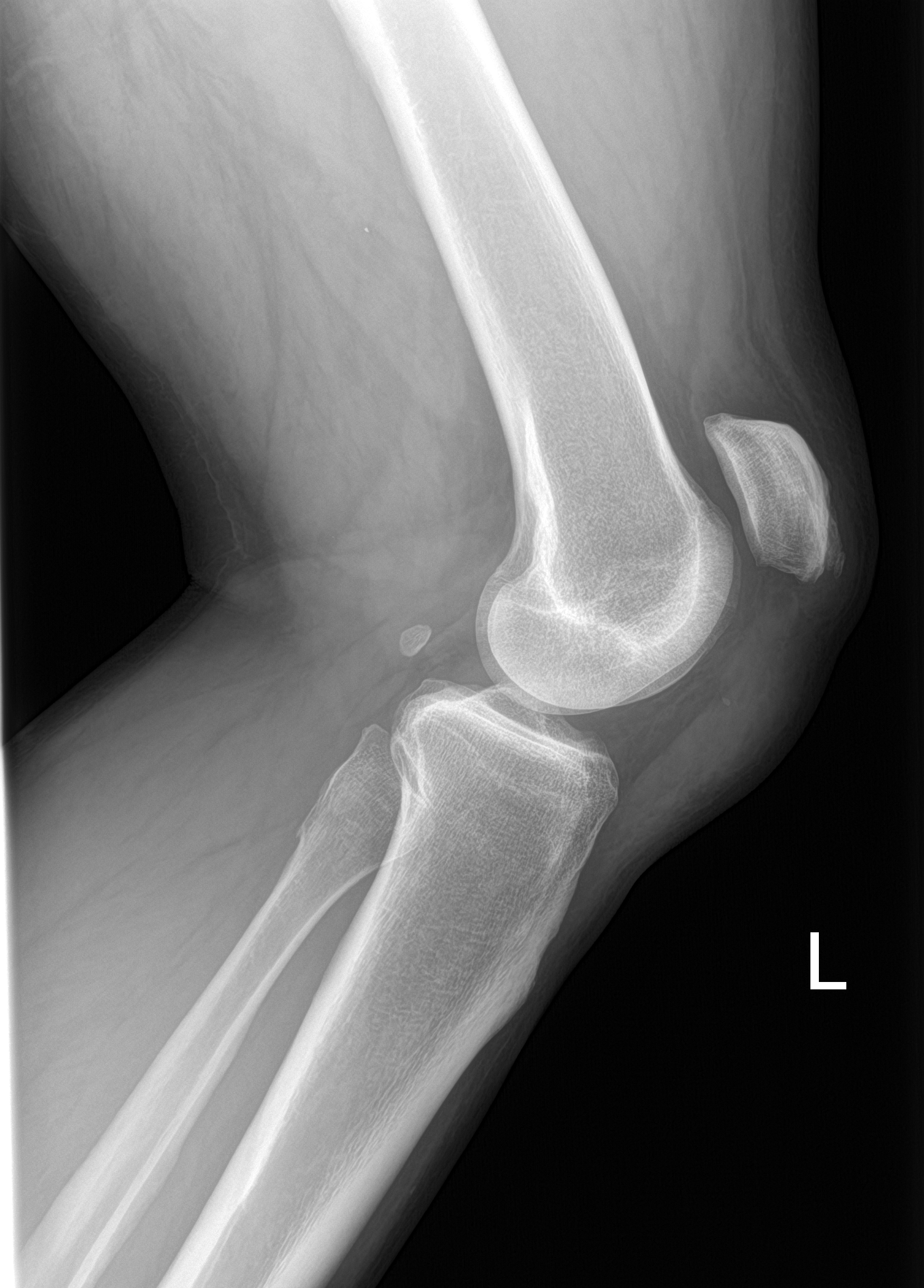

[knee obl (1 of 2)]
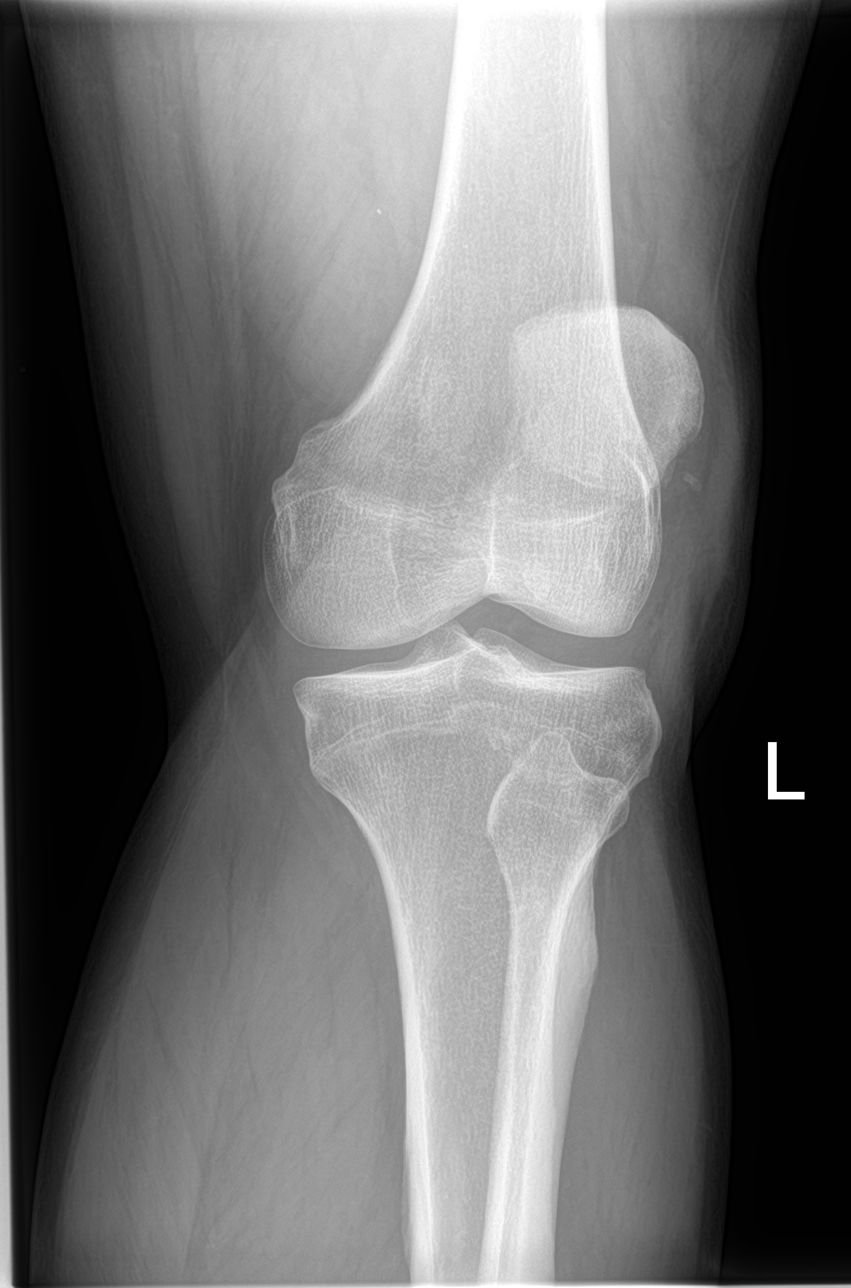

[knee obl (2 of 2)]
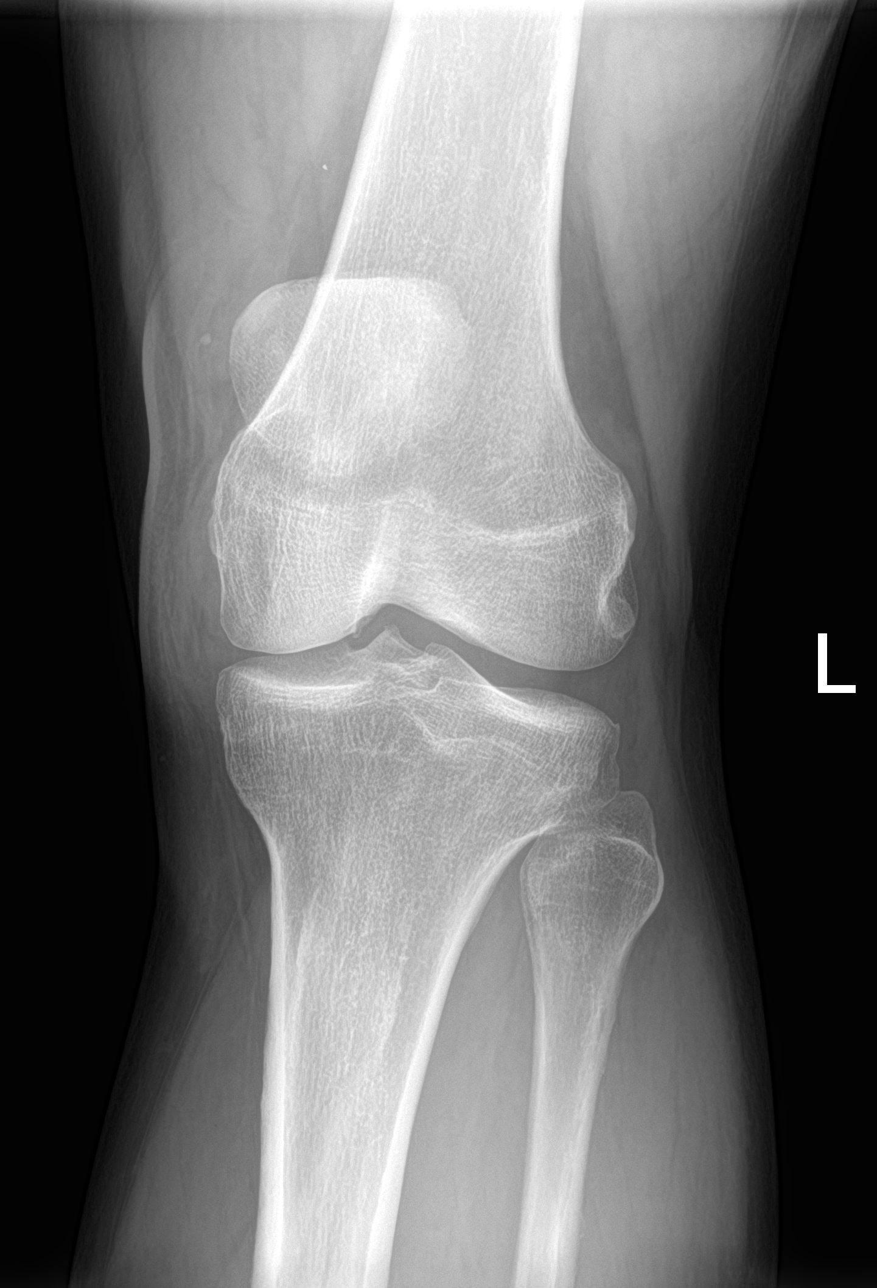

[4 of 4 positions shown; findings below may reference images not displayed]

FINDINGS: There is no evidence of fracture, dislocation, or joint effusion.
There is no evidence of significant arthropathy or other focal bone
abnormality. Soft tissues are unremarkable.
IMPRESSION: Negative.

## 2017-07-07 ENCOUNTER — Encounter: Payer: Self-pay | Admitting: Interventional Cardiology

## 2017-07-23 ENCOUNTER — Encounter: Payer: Self-pay | Admitting: Interventional Cardiology

## 2017-07-23 ENCOUNTER — Ambulatory Visit: Payer: BC Managed Care – PPO | Admitting: Interventional Cardiology

## 2017-07-23 VITALS — BP 110/66 | HR 92 | Ht 69.0 in | Wt 233.2 lb

## 2017-07-23 DIAGNOSIS — E669 Obesity, unspecified: Secondary | ICD-10-CM

## 2017-07-23 DIAGNOSIS — E7800 Pure hypercholesterolemia, unspecified: Secondary | ICD-10-CM

## 2017-07-23 DIAGNOSIS — I471 Supraventricular tachycardia, unspecified: Secondary | ICD-10-CM

## 2017-07-23 DIAGNOSIS — I10 Essential (primary) hypertension: Secondary | ICD-10-CM

## 2017-07-23 DIAGNOSIS — E66811 Obesity, class 1: Secondary | ICD-10-CM

## 2017-07-23 NOTE — Patient Instructions (Signed)
Medication Instructions:  Your physician recommends that you continue on your current medications as directed. Please refer to the Current Medication list given to you today.   Labwork: Your physician recommends that you return for FASTING lab work: CMET, LIPIDS   Testing/Procedures: None ordered  Follow-Up: Your physician wants you to follow-up in: 1 year with Dr. Varanasi. You will receive a reminder letter in the mail two months in advance. If you don't receive a letter, please call our office to schedule the follow-up appointment.   Any Other Special Instructions Will Be Listed Below (If Applicable).     If you need a refill on your cardiac medications before your next appointment, please call your pharmacy.   

## 2017-07-23 NOTE — Progress Notes (Signed)
Cardiology Office Note   Date:  07/23/2017   ID:  Sean Booth, DOB 01/06/1961, MRN [id number]  PCP:  Catha GosselinLittle, Kevin, MD    No chief complaint on file. SVT   Wt Readings from Last 3 Encounters:  07/23/17 233 lb 3.2 oz (105.8 kg)  07/22/16 233 lb 12.8 oz (106.1 kg)  07/03/15 234 lb (106.1 kg)       History of Present Illness: Sean Booth is a 56 y.o. male  who has had issues with alcohol and had tachycardia in the past. He had an SVT ablation in 2008. He had some tachycardia after. He was given a prescription for a beta blocker.    His PMD thought it was panic attacks/anxiety. He was on Zoloft and Xanax in the past.   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  He works for the [employer] ([employer]).He has had less stress at work with the current job. He had stopped drinking and was going to [employer] meetings. He restarted moderate alcohol.He now only rarely drinks- one beer every few weeks.     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  This slowed down his exercise.    Since the last visit, he has done well.    Denies : Chest pain. Dizziness. Leg edema. Nitroglycerin use. Orthopnea. Palpitations. Paroxysmal nocturnal dyspnea. Shortness of breath. Syncope.   Not checking BP at home.  He thinks BP is higher for him on the doctor's office.   He walks, not on a regular basis, but stays active with fishing and walking.  He tries to eat healthy.    Past Medical History:  Diagnosis Date  . Anxiety   . GERD (gastroesophageal reflux disease)   . Hypercholesteremia   . Hypertension   . SVT (supraventricular tachycardia) (HCC)     Past Surgical History:  Procedure Laterality Date  . COLONOSCOPY    . RADIOFREQUENCY ABLATION     for SVT (01/2011)     Current Outpatient Medications  Medication Sig Dispense Refill  . ibuprofen (ADVIL,MOTRIN) 600 MG tablet Take 1 tablet (600 mg total) by mouth every 6 (six) hours as needed. 30  tablet 0  . lisinopril (PRINIVIL,ZESTRIL) 40 MG tablet TAKE 1 TABLET BY MOUTH DAILY 90 tablet 3  . metoprolol succinate (TOPROL-XL) 25 MG 24 hr tablet TAKE 1 TABLET (25 MG TOTAL) BY MOUTH DAILY. 90 tablet 3  . simvastatin (ZOCOR) 20 MG tablet TAKE 1 TABLET (20 MG TOTAL) BY MOUTH DAILY AT 6 PM. 90 tablet 0   No current facility-administered medications for this visit.     Allergies:   Patient has no known allergies.    Social History:  The patient  reports that  has never smoked. he has never used smokeless tobacco. He reports that he drinks alcohol. He reports that he does not use drugs.   Family History:  The patient's family history includes Anxiety disorder in his mother; Heart attack in his father and maternal grandfather; Hypertension in his mother.    ROS:  Please see the history of present illness.   Otherwise, review of systems are positive for difficulty losing weight.   All other systems are reviewed and negative.    PHYSICAL EXAM: VS:  BP (!) 150/88   Pulse 92   Ht 5\' 9"  (1.753 m)   Wt 233 lb 3.2 oz (105.8 kg)   SpO2 98%   BMI 34.44 kg/m  ,  BMI Body mass index is 34.44 kg/m. GEN: THin, well developed, in no acute distress  HEENT: normal  Neck: no JVD, carotid bruits, or masses Cardiac: RRR; no murmurs, rubs, or gallops,no edema  Respiratory:  clear to auscultation bilaterally, normal work of breathing GI: soft, nontender, nondistended, + BS MS: no deformity or atrophy  Skin: warm and dry, no rash Neuro:  Strength and sensation are intact Psych: euthymic mood, full affect   EKG:   The ekg ordered today demonstrates NSR, no ST segment changes   Recent Labs: 07/29/2016: ALT 22; BUN 16; Creat 1.23; Potassium 4.4; Sodium 137   Lipid Panel    Component Value Date/Time   CHOL 171 07/29/2016 0924   TRIG 88 07/29/2016 0924   HDL 34 (L) 07/29/2016 0924   CHOLHDL 5.0 (H) 07/29/2016 0924   VLDL 18 07/29/2016 0924   LDLCALC 119 (H) 07/29/2016 0924     Other  studies Reviewed: Additional studies/ records that were reviewed today with results demonstrating: 2007 echo: normal LV function.   ASSESSMENT AND PLAN:  1. SVT: s/p ablation several years ago. COntinue metoprolol. 2. Hyperlipidemia: LDL 119 in 2017.  Recheck lipids when fasting. 3. HTN: Continue lisinopril.  Check BP at home.  He thinks that it is better at home.  BP on my check today is 110/66. 4. Obesity: Hoping to try to lose wght with portion size control.   Current medicines are reviewed at length with the patient today.  The patient concerns regarding his medicines were addressed.  The following changes have been made:  No change  Labs/ tests ordered today include: No orders of the defined types were placed in this encounter.   Recommend 150 minutes/week of aerobic exercise Low fat, low carb, high fiber diet recommended  Disposition:   FU in 1 year   Signed, Lance MussJayadeep Yehudah Standing, MD  07/23/2017 3:40 PM    [facility] [address], [place], [place]  [address] Phone: [phone]; Fax: [phone]

## 2017-07-24 ENCOUNTER — Other Ambulatory Visit: Payer: Self-pay | Admitting: Interventional Cardiology

## 2017-07-27 ENCOUNTER — Other Ambulatory Visit: Payer: BC Managed Care – PPO

## 2018-07-12 ENCOUNTER — Other Ambulatory Visit: Payer: Self-pay | Admitting: Interventional Cardiology

## 2018-07-19 ENCOUNTER — Other Ambulatory Visit: Payer: Self-pay | Admitting: Interventional Cardiology

## 2018-08-11 ENCOUNTER — Other Ambulatory Visit: Payer: Self-pay | Admitting: *Deleted

## 2018-08-11 ENCOUNTER — Telehealth: Payer: Self-pay | Admitting: Interventional Cardiology

## 2018-08-11 MED ORDER — LISINOPRIL 40 MG PO TABS: 40.0000 mg | ORAL_TABLET | Freq: Every day | ORAL | 1 refills | Status: DC

## 2018-08-11 MED ORDER — METOPROLOL SUCCINATE ER 25 MG PO TB24: 25.0000 mg | ORAL_TABLET | Freq: Every day | ORAL | 1 refills | Status: DC

## 2018-08-11 NOTE — Telephone Encounter (Signed)
New message   *STAT* If patient is at the pharmacy, call can be transferred to refill team.   1. Which medications need to be refilled? (please list name of each medication and dose if known)   metoprolol succinate (TOPROL-XL) 25 MG 24 hr tablet   lisinopril (PRINIVIL,ZESTRIL) 40 MG tablet     2. Which pharmacy/location (including street and city if local pharmacy) is medication to be sent to?[facility] [address] - [place], [place] - [address]  3. Do they need a 30 day or 90 day supply? 90

## 2018-08-24 ENCOUNTER — Other Ambulatory Visit: Payer: Self-pay | Admitting: Interventional Cardiology

## 2018-09-22 ENCOUNTER — Ambulatory Visit: Payer: BC Managed Care – PPO | Admitting: Physician Assistant

## 2018-10-05 ENCOUNTER — Other Ambulatory Visit: Payer: Self-pay

## 2018-10-05 MED ORDER — LISINOPRIL 40 MG PO TABS: 40.0000 mg | ORAL_TABLET | Freq: Every day | ORAL | 0 refills | Status: DC

## 2018-10-05 MED ORDER — METOPROLOL SUCCINATE ER 25 MG PO TB24: 25.0000 mg | ORAL_TABLET | Freq: Every day | ORAL | 0 refills | Status: DC

## 2018-10-08 ENCOUNTER — Other Ambulatory Visit: Payer: Self-pay | Admitting: Interventional Cardiology

## 2018-10-20 ENCOUNTER — Ambulatory Visit: Payer: BC Managed Care – PPO | Admitting: Physician Assistant

## 2018-10-20 ENCOUNTER — Encounter (INDEPENDENT_AMBULATORY_CARE_PROVIDER_SITE_OTHER): Payer: Self-pay

## 2018-10-20 ENCOUNTER — Encounter: Payer: Self-pay | Admitting: Physician Assistant

## 2018-10-20 VITALS — BP 132/82 | HR 86 | Ht 69.0 in | Wt 247.1 lb

## 2018-10-20 DIAGNOSIS — E7800 Pure hypercholesterolemia, unspecified: Secondary | ICD-10-CM | POA: Diagnosis not present

## 2018-10-20 DIAGNOSIS — F1011 Alcohol abuse, in remission: Secondary | ICD-10-CM | POA: Diagnosis not present

## 2018-10-20 DIAGNOSIS — I471 Supraventricular tachycardia: Secondary | ICD-10-CM

## 2018-10-20 DIAGNOSIS — I10 Essential (primary) hypertension: Secondary | ICD-10-CM | POA: Diagnosis not present

## 2018-10-20 MED ORDER — LISINOPRIL 40 MG PO TABS: 40.0000 mg | ORAL_TABLET | Freq: Every day | ORAL | 3 refills | Status: DC

## 2018-10-20 MED ORDER — METOPROLOL SUCCINATE ER 25 MG PO TB24: 25.0000 mg | ORAL_TABLET | Freq: Every day | ORAL | 3 refills | Status: DC

## 2018-10-20 MED ORDER — SIMVASTATIN 20 MG PO TABS: 20.0000 mg | ORAL_TABLET | Freq: Every day | ORAL | 3 refills | Status: DC

## 2018-10-20 NOTE — Patient Instructions (Signed)
Medication Instructions:  Your physician recommends that you continue on your current medications as directed. Please refer to the Current Medication list given to you today.  If you need a refill on your cardiac medications before your next appointment, please call your pharmacy.   Lab work: Your physician recommends that you return for a FASTING lipid profile, AND COMPLETE METABOLIC PANEL, AND COMPLETE BLOOD COUNT   If you have labs (blood work) drawn today and your tests are completely normal, you will receive your results only by: Marland Kitchen MyChart Message (if you have MyChart) OR . A paper copy in the mail If you have any lab test that is abnormal or we need to change your treatment, we will call you to review the results.  Testing/Procedures: None ordered  Follow-Up: At [facility], you and your health needs are our priority.  As part of our continuing mission to provide you with exceptional heart care, we have created designated Provider Care Teams.  These Care Teams include your primary Cardiologist (physician) and Advanced Practice Providers (APPs -  Physician Assistants and Nurse Practitioners) who all work together to provide you with the care you need, when you need it. . You will need a follow up appointment in 1 year.  Please call our office 2 months in advance to schedule this appointment.  You may see Everette Rank, MD or one of the following Advanced Practice Providers on your designated Care Team:   . Robbie Lis, PA-C . Dayna Dunn, PA-C . Jacolyn Reedy, PA-C  Any Other Special Instructions Will Be Listed Below (If Applicable).

## 2018-10-20 NOTE — Progress Notes (Signed)
Cardiology Office Note    Date:  10/20/2018   ID:  Sean Booth, DOB 1960-10-22, MRN [id number]  PCP:  Catha Gosselin, MD  Cardiologist: Lance Muss, MD EPS: None  Chief Complaint  Patient presents with  . Follow-up    History of Present Illness:  Sean Booth is a 58 y.o. male with history of SVT ablation 2008.  History of EtOH and tachycardia after that treated with beta-blocker, history of anxiety and panic attacks, hypertension, HLD, obesity.  Last saw Dr. Eldridge Dace 07/23/2017 at which time he was doing well.  Patient comes in for yearly f/u. Never had lipids done last year. Was exercising-walking 3-4 days/week 2-4 miles but not in the past several months. 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  Denies any chest pain, palpitations, dyspnea, dyspnea on exertion, dizziness or presyncope.  Has been sick with bronchitis and upper respiratory infections for the past several months.    Past Medical History:  Diagnosis Date  . Anxiety   . GERD (gastroesophageal reflux disease)   . Hypercholesteremia   . Hypertension   . SVT (supraventricular tachycardia) (HCC)     Past Surgical History:  Procedure Laterality Date  . COLONOSCOPY    . RADIOFREQUENCY ABLATION     for SVT (01/2011)    Current Medications: Current Meds  Medication Sig  . lisinopril (PRINIVIL,ZESTRIL) 40 MG tablet Take 1 tablet (40 mg total) by mouth daily.  . metoprolol succinate (TOPROL-XL) 25 MG 24 hr tablet Take 1 tablet (25 mg total) by mouth daily.  . simvastatin (ZOCOR) 20 MG tablet Take 1 tablet (20 mg total) by mouth daily at 6 PM.  . [DISCONTINUED] ibuprofen (ADVIL,MOTRIN) 600 MG tablet Take 1 tablet (600 mg total) by mouth every 6 (six) hours as needed.  . [DISCONTINUED] lisinopril (PRINIVIL,ZESTRIL) 40 MG tablet Take 1 tablet (40 mg total) by mouth daily. Patient needs to keep upcoming appointment for  further refills  . [DISCONTINUED] metoprolol succinate (TOPROL-XL) 25 MG 24 hr tablet Take 1 tablet (25 mg total) by mouth daily. Patient needs to keep upcoming appointment for further refills  . [DISCONTINUED] simvastatin (ZOCOR) 20 MG tablet Take 1 tablet (20 mg total) by mouth daily at 6 PM. Please keep upcoming appt in January for future refills. Thank you     Allergies:   Patient has no known allergies.   Social History   Socioeconomic History  . Marital status: Married    Spouse name: Not on file  . Number of children: Not on file  . Years of education: Not on file  . Highest education level: Not on file  Occupational History  . Not on file  Social Needs  . Financial resource strain: Not on file  . Food insecurity:    Worry: Not on file    Inability: Not on file  . Transportation needs:    Medical: Not on file    Non-medical: Not on file  Tobacco Use  . Smoking status: Never Smoker  . Smokeless tobacco: Never Used  Substance and Sexual Activity  . Alcohol use: Yes    Comment: heavy drinker  . Drug use: No  . Sexual activity: Yes  Lifestyle  . Physical activity:    Days per week: Not on file    Minutes per session: Not on file  . Stress: Not on file  Relationships  . Social connections:  Talks on phone: Not on file    Gets together: Not on file    Attends religious service: Not on file    Active member of club or organization: Not on file    Attends meetings of clubs or organizations: Not on file    Relationship status: Not on file  Other Topics Concern  . Not on file  Social History Narrative  . Not on file     Family History:  The patient's family history includes Anxiety disorder in his mother; Heart attack in his father and maternal grandfather; Hypertension in his mother.   ROS:   Please see the history of present illness.    Review of Systems  Constitution: Positive for weight gain.  HENT: Negative.   Cardiovascular: Negative.   Respiratory:  Negative.   Endocrine: Negative.   Hematologic/Lymphatic: Negative.   Musculoskeletal: Negative.   Gastrointestinal: Negative.   Genitourinary: Negative.   Neurological: Negative.    All other systems reviewed and are negative.   PHYSICAL EXAM:   VS:  BP 132/82   Pulse 86   Ht 5\' 9"  (1.753 m)   Wt 247 lb 1.9 oz (112.1 kg)   SpO2 94%   BMI 36.49 kg/m   Physical Exam  GEN: Obese, in no acute distress  Neck: no JVD, carotid bruits, or masses Cardiac:RRR; no murmurs, rubs, or gallops  Respiratory:  clear to auscultation bilaterally, normal work of breathing GI: soft, nontender, nondistended, + BS Ext: without cyanosis, clubbing, or edema, Good distal pulses bilaterally Neuro:  Alert and Oriented x 3 Psych: euthymic mood, full affect  Wt Readings from Last 3 Encounters:  10/20/18 247 lb 1.9 oz (112.1 kg)  07/23/17 233 lb 3.2 oz (105.8 kg)  07/22/16 233 lb 12.8 oz (106.1 kg)      Studies/Labs Reviewed:   EKG:  EKG is  ordered today.  The ekg ordered today demonstrates normal sinus rhythm, normal EKG  Recent Labs: No results found for requested labs within last 8760 hours.   Lipid Panel    Component Value Date/Time   CHOL 171 07/29/2016 0924   TRIG 88 07/29/2016 0924   HDL 34 (L) 07/29/2016 0924   CHOLHDL 5.0 (H) 07/29/2016 0924   VLDL 18 07/29/2016 0924   LDLCALC 119 (H) 07/29/2016 0924    Additional studies/ records that were reviewed today include:    2D echo 2007 normal   ASSESSMENT:    1. SVT (supraventricular tachycardia) (HCC)   2. Essential hypertension, benign   3. Hypercholesteremia   4. H/O ETOH abuse      PLAN:  In order of problems listed above:  History of SVT status post ablation 2012 no recurrence.  Follow-up with Dr. Eldridge Dace in 1 year.  Essential hypertension blood pressure well controlled  Hypercholesterolemia never had lipids drawn last year.  Will order fasting lipid panel and surveillance labs.  To come back tomorrow.  History  of EtOH only drinks minimal alcohol every few weeks.    Medication Adjustments/Labs and Tests Ordered: Current medicines are reviewed at length with the patient today.  Concerns regarding medicines are outlined above.  Medication changes, Labs and Tests ordered today are listed in the Patient Instructions below. Patient Instructions  Medication Instructions:  Your physician recommends that you continue on your current medications as directed. Please refer to the Current Medication list given to you today.  If you need a refill on your cardiac medications before your next appointment, please call your pharmacy.  Lab work: Your physician recommends that you return for a FASTING lipid profile, AND COMPLETE METABOLIC PANEL, AND COMPLETE BLOOD COUNT   If you have labs (blood work) drawn today and your tests are completely normal, you will receive your results only by: Marland Kitchen. MyChart Message (if you have MyChart) OR . A paper copy in the mail If you have any lab test that is abnormal or we need to change your treatment, we will call you to review the results.  Testing/Procedures: None ordered  Follow-Up: At [facility], you and your health needs are our priority.  As part of our continuing mission to provide you with exceptional heart care, we have created designated Provider Care Teams.  These Care Teams include your primary Cardiologist (physician) and Advanced Practice Providers (APPs -  Physician Assistants and Nurse Practitioners) who all work together to provide you with the care you need, when you need it. . You will need a follow up appointment in 1 year.  Please call our office 2 months in advance to schedule this appointment.  You may see Everette RankJay Varanasi, MD or one of the following Advanced Practice Providers on your designated Care Team:   . Robbie LisBrittainy Simmons, PA-C . Dayna Dunn, PA-C . Jacolyn ReedyMichele Nailani Full, PA-C  Any Other Special Instructions Will Be Listed Below (If Applicable).        Signed, Jacolyn ReedyMichele Briana Farner, PA-C  10/20/2018 1:24 PM    [facility] [address], [place], [place]  [address] Phone: [phone]; Fax: [phone]

## 2018-11-08 ENCOUNTER — Other Ambulatory Visit: Payer: BC Managed Care – PPO

## 2018-11-08 DIAGNOSIS — I10 Essential (primary) hypertension: Secondary | ICD-10-CM

## 2018-11-08 DIAGNOSIS — I471 Supraventricular tachycardia: Secondary | ICD-10-CM

## 2018-11-08 DIAGNOSIS — E7800 Pure hypercholesterolemia, unspecified: Secondary | ICD-10-CM

## 2018-11-10 ENCOUNTER — Other Ambulatory Visit: Payer: Self-pay

## 2018-11-10 DIAGNOSIS — E7800 Pure hypercholesterolemia, unspecified: Secondary | ICD-10-CM

## 2018-11-10 LAB — COMPREHENSIVE METABOLIC PANEL
A/G RATIO: 3.1 — AB (ref 1.2–2.2)
ALK PHOS: 39 IU/L (ref 39–117)
ALT: 32 IU/L (ref 0–44)
AST: 17 IU/L (ref 0–40)
Albumin: 4.7 g/dL (ref 3.8–4.9)
BUN/Creatinine Ratio: 17 (ref 9–20)
BUN: 18 mg/dL (ref 6–24)
Bilirubin Total: 0.9 mg/dL (ref 0.0–1.2)
CHLORIDE: 101 mmol/L (ref 96–106)
CO2: 20 mmol/L (ref 20–29)
CREATININE: 1.05 mg/dL (ref 0.76–1.27)
Calcium: 9.2 mg/dL (ref 8.7–10.2)
GFR calc Af Amer: 91 mL/min/{1.73_m2} (ref >59)
GFR calc non Af Amer: 78 mL/min/{1.73_m2} (ref >59)
GLOBULIN, TOTAL: 1.5 g/dL (ref 1.5–4.5)
Glucose: 119 mg/dL — ABNORMAL HIGH (ref 65–99)
Potassium: 4.5 mmol/L (ref 3.5–5.2)
SODIUM: 136 mmol/L (ref 134–144)
Total Protein: 6.2 g/dL (ref 6.0–8.5)

## 2018-11-10 LAB — CBC
HEMATOCRIT: 40.7 % (ref 37.5–51.0)
Hemoglobin: 13.7 g/dL (ref 13.0–17.7)
MCH: 30 pg (ref 26.6–33.0)
MCHC: 33.7 g/dL (ref 31.5–35.7)
MCV: 89 fL (ref 79–97)
PLATELETS: 223 10*3/uL (ref 150–450)
RBC: 4.57 x10E6/uL (ref 4.14–5.80)
RDW: 12.9 % (ref 11.6–15.4)
WBC: 6.6 10*3/uL (ref 3.4–10.8)

## 2018-11-10 LAB — LIPID PANEL
Chol/HDL Ratio: 5.2 ratio — ABNORMAL HIGH (ref 0.0–5.0)
Cholesterol, Total: 183 mg/dL (ref 100–199)
HDL: 35 mg/dL — AB (ref >39)
LDL Calculated: 120 mg/dL — ABNORMAL HIGH (ref 0–99)
TRIGLYCERIDES: 141 mg/dL (ref 0–149)
VLDL CHOLESTEROL CAL: 28 mg/dL (ref 5–40)

## 2018-11-10 MED ORDER — SIMVASTATIN 40 MG PO TABS: 40.0000 mg | ORAL_TABLET | Freq: Every day | ORAL | 3 refills | Status: DC

## 2019-01-31 ENCOUNTER — Other Ambulatory Visit: Payer: BC Managed Care – PPO

## 2019-04-07 ENCOUNTER — Telehealth: Payer: Self-pay | Admitting: Interventional Cardiology

## 2019-04-07 MED ORDER — METOPROLOL SUCCINATE ER 50 MG PO TB24: 50.0000 mg | ORAL_TABLET | Freq: Every day | ORAL | 3 refills | Status: DC

## 2019-04-07 NOTE — Telephone Encounter (Signed)
Follow Up  Patient returning phone call about medication. Please give patient a call back.

## 2019-04-07 NOTE — Telephone Encounter (Signed)
Attempted to reach pt phone number listed states mailbox is full.Unable to leave message.Will try later .Adonis Housekeeper

## 2019-04-07 NOTE — Telephone Encounter (Signed)
Patient also states that he has a personal phone that you can call him on [phone].

## 2019-04-07 NOTE — Telephone Encounter (Signed)
°  Patient called back returning Christine's Call. He has some cell phone issues and can not receive voicemail

## 2019-04-07 NOTE — Telephone Encounter (Signed)
New Message          Patient has a question about his medication, pls call to advise.

## 2019-04-07 NOTE — Telephone Encounter (Signed)
OK to increase metoprolol ER to 50 mg daily

## 2019-04-07 NOTE — Telephone Encounter (Signed)
Lm to call back ./cy 

## 2019-04-07 NOTE — Telephone Encounter (Signed)
**Note De-Identified Kenzie Thoreson Obfuscation** The pt did not answer but did identify himself on his VM message so I left a detailed message advising him that Dr Irish Lack recommends that he increase his dose of Metoprolol to 50 mg daily and that I have sent his refill into [facility] to fill. Since there is a dose change his insurance should cover refill now. I left the offices phone number in the message and asked that he call us back if he has any questions.

## 2019-04-07 NOTE — Telephone Encounter (Signed)
**Note De-Identified Aracelly Tencza Obfuscation** The pt states that he has been going through some issues in his personal life since the Covid-19 quarantine began that he believes is causing his HR to elevate especially after he eats.  He does reports that he is being seen at the [facility] for his alcohol abuse and is now on medication to help him detox and feels this may be contributing to his elevated HR.  He states that his HR gets as high as 100 to 110 bpm and that he feels it happening. He states that he has been taking 1/2 to 1 extra Metoprolol tablet daily when this happens and is now running out of his Metoprolol early.  He wants to know if maybe is Metoprolol dose may need to be adjusted as he states he has taken an extra tablet daily for almost 2 weeks.  Please advise.

## 2019-04-08 NOTE — Telephone Encounter (Signed)
Spoke with pt and did get message from yesterday that Soldier had left ./cy

## 2019-11-07 ENCOUNTER — Other Ambulatory Visit: Payer: Self-pay | Admitting: Physician Assistant

## 2019-11-26 ENCOUNTER — Other Ambulatory Visit: Payer: Self-pay | Admitting: Physician Assistant

## 2019-12-10 ENCOUNTER — Other Ambulatory Visit: Payer: Self-pay | Admitting: Physician Assistant

## 2019-12-12 ENCOUNTER — Other Ambulatory Visit: Payer: Self-pay | Admitting: Physician Assistant

## 2019-12-12 ENCOUNTER — Other Ambulatory Visit: Payer: Self-pay | Admitting: Interventional Cardiology

## 2020-01-11 NOTE — Progress Notes (Signed)
Cardiology Office Note   Date:  01/12/2020   ID:  Sean Booth, DOB [birth year], MRN [id number]  PCP:  Sean Fess, MD    No chief complaint on file.  SVT  Wt Readings from Last 3 Encounters:  01/12/20 268 lb 12.8 oz (121.9 kg)  10/20/18 247 lb 1.9 oz (112.1 kg)  07/23/17 233 lb 3.2 oz (105.8 kg)       History of Present Illness: Sean Booth is a 59 y.o. male  who has had issues with alcohol and had tachycardia in the past. He had an SVT ablation in 2008. He had some tachycardia after. He was given a prescription for a beta blocker.   His PMD thought it was panic attacks/anxiety. He was on Zoloft and Xanax in the past.   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 He works for the [employer]).He has had less stress at work with the current job. He had stopped drinking and was going to [employer]. He restarted moderate alcohol.He then cut back to one beer every few weeks.  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 This slowed down his exercise.   Since the last visit, he gained 20 lbs.  Denies : Chest pain. Dizziness. Leg edema. Nitroglycerin use. Orthopnea. Palpitations. Paroxysmal nocturnal dyspnea. Shortness of breath. Syncope.   Wife had CHF and was in the hospital, and will need an AICD. 70 year old  Daughter had DRES syndrome- allergic response to medicine.   Denies : Chest pain. Dizziness. Leg edema. Nitroglycerin use. Orthopnea. Palpitations. Paroxysmal nocturnal dyspnea. Shortness of breath. Syncope.   Still working for [employer].    He has been out of medicine for a few days.      Past Medical History:  Diagnosis Date  . Anxiety   . GERD (gastroesophageal reflux disease)   . Hypercholesteremia   . Hypertension   . SVT (supraventricular tachycardia) (HCC)     Past Surgical History:  Procedure Laterality Date  . COLONOSCOPY    . RADIOFREQUENCY ABLATION     for SVT (01/2011)      Current Outpatient Medications  Medication Sig Dispense Refill  . lisinopril (ZESTRIL) 40 MG tablet TAKE ONE TABLET BY MOUTH DAILY 15 tablet 0  . metoprolol succinate (TOPROL-XL) 50 MG 24 hr tablet Take 1 tablet (50 mg total) by mouth daily. Take with or immediately following a meal. 90 tablet 3  . simvastatin (ZOCOR) 40 MG tablet TAKE ONE TABLET BY MOUTH DAILY AT 6PM 30 tablet 0   No current facility-administered medications for this visit.    Allergies:   Patient has no known allergies.    Social History:  The patient  reports that he has never smoked. He has never used smokeless tobacco. He reports current alcohol use. He reports that he does not use drugs.   Family History:  The patient's family history includes Anxiety disorder in his mother; Heart attack in his father and maternal grandfather; Hypertension in his mother.    ROS:  Please see the history of present illness.   Otherwise, review of systems are positive for weight gain.   All other systems are reviewed and negative.    PHYSICAL EXAM: VS:  BP (!) 146/90   Pulse 85   Ht 5\' 9"  (1.753 m)   Wt 268 lb 12.8 oz (121.9 kg)   SpO2 96%   BMI 39.69 kg/m  , BMI Body mass  index is 39.69 kg/m. GEN: Well nourished, well developed, in no acute distress  HEENT: normal  Neck: no JVD, carotid bruits, or masses Cardiac: RRR; no murmurs, rubs, or gallops,no edema  Respiratory:  clear to auscultation bilaterally, normal work of breathing GI: soft, nontender, nondistended, + BS MS: no deformity or atrophy  Skin: warm and dry, no rash Neuro:  Strength and sensation are intact Psych: euthymic mood, full affect   EKG:   The ekg ordered today demonstrates NSR, no ST segment changes   Recent Labs: No results found for requested labs within last 8760 hours.   Lipid Panel    Component Value Date/Time   CHOL 183 11/08/2018 0930   TRIG 141 11/08/2018 0930   HDL 35 (L) 11/08/2018 0930   CHOLHDL 5.2 (H) 11/08/2018 0930    CHOLHDL 5.0 (H) 07/29/2016 0924   VLDL 18 07/29/2016 0924   LDLCALC 120 (H) 11/08/2018 0930     Other studies Reviewed: Additional studies/ records that were reviewed today with results demonstrating: 2020 labs reviewed.   ASSESSMENT AND PLAN:  1. SVT: s/p ablation.  No palpitations. Refill metoprolol. 2. HTN: Low salt diet.  Refill meds.  Check labs today. 3. Hyperlipidemia: Continue simvastatin. Need recheck of lipids.  4. Obesity: We spoke about whole food plant based diet.  Reduce red meat intake.    Current medicines are reviewed at length with the patient today.  The patient concerns regarding his medicines were addressed.  The following changes have been made:  No change  Labs/ tests ordered today include:  No orders of the defined types were placed in this encounter.   Recommend 150 minutes/week of aerobic exercise Low fat, low carb, high fiber diet recommended  Disposition:   FU in 1 year   Signed, Lance Muss, MD  01/12/2020 3:34 PM    [facility] [address] [place], [place], [place]  [address] Phone: [phone]; Fax: [phone]

## 2020-01-12 ENCOUNTER — Encounter: Payer: Self-pay | Admitting: Interventional Cardiology

## 2020-01-12 ENCOUNTER — Other Ambulatory Visit: Payer: Self-pay

## 2020-01-12 ENCOUNTER — Ambulatory Visit: Payer: BC Managed Care – PPO | Admitting: Interventional Cardiology

## 2020-01-12 VITALS — BP 146/90 | HR 85 | Ht 69.0 in | Wt 268.8 lb

## 2020-01-12 DIAGNOSIS — I471 Supraventricular tachycardia: Secondary | ICD-10-CM

## 2020-01-12 DIAGNOSIS — F1011 Alcohol abuse, in remission: Secondary | ICD-10-CM | POA: Diagnosis not present

## 2020-01-12 DIAGNOSIS — I10 Essential (primary) hypertension: Secondary | ICD-10-CM

## 2020-01-12 DIAGNOSIS — R7301 Impaired fasting glucose: Secondary | ICD-10-CM

## 2020-01-12 DIAGNOSIS — E7800 Pure hypercholesterolemia, unspecified: Secondary | ICD-10-CM | POA: Diagnosis not present

## 2020-01-12 MED ORDER — LISINOPRIL 40 MG PO TABS: 40.0000 mg | ORAL_TABLET | Freq: Every day | ORAL | 3 refills | Status: DC

## 2020-01-12 MED ORDER — SIMVASTATIN 40 MG PO TABS: ORAL_TABLET | ORAL | 3 refills | Status: DC

## 2020-01-12 MED ORDER — METOPROLOL SUCCINATE ER 50 MG PO TB24: 50.0000 mg | ORAL_TABLET | Freq: Every day | ORAL | 3 refills | Status: DC

## 2020-01-12 NOTE — Patient Instructions (Signed)
Medication Instructions:  Your physician recommends that you continue on your current medications as directed. Please refer to the Current Medication list given to you today.  *If you need a refill on your cardiac medications before your next appointment, please call your pharmacy*   Lab Work: TODAY: CMET, LIPIDS, CBC, A1C  If you have labs (blood work) drawn today and your tests are completely normal, you will receive your results only by: Marland Kitchen MyChart Message (if you have MyChart) OR . A paper copy in the mail If you have any lab test that is abnormal or we need to change your treatment, we will call you to review the results.   Testing/Procedures: None ordered   Follow-Up: At [facility], you and your health needs are our priority.  As part of our continuing mission to provide you with exceptional heart care, we have created designated Provider Care Teams.  These Care Teams include your primary Cardiologist (physician) and Advanced Practice Providers (APPs -  Physician Assistants and Nurse Practitioners) who all work together to provide you with the care you need, when you need it.  We recommend signing up for the patient portal called "MyChart".  Sign up information is provided on this After Visit Summary.  MyChart is used to connect with patients for Virtual Visits (Telemedicine).  Patients are able to view lab/test results, encounter notes, upcoming appointments, etc.  Non-urgent messages can be sent to your provider as well.   To learn more about what you can do with MyChart, go to [email or web address].    Your next appointment:   12 month(s)  The format for your next appointment:   In Person  Provider:   You may see Lance Muss, MD or one of the following Advanced Practice Providers on your designated Care Team:    Ronie Spies, PA-C  Jacolyn Reedy, PA-C    Other Instructions

## 2020-01-13 LAB — CBC
Hematocrit: 42.1 % (ref 37.5–51.0)
Hemoglobin: 14.4 g/dL (ref 13.0–17.7)
MCH: 30.6 pg (ref 26.6–33.0)
MCHC: 34.2 g/dL (ref 31.5–35.7)
MCV: 90 fL (ref 79–97)
Platelets: 286 10*3/uL (ref 150–450)
RBC: 4.7 x10E6/uL (ref 4.14–5.80)
RDW: 12.8 % (ref 11.6–15.4)
WBC: 9.3 10*3/uL (ref 3.4–10.8)

## 2020-01-13 LAB — COMPREHENSIVE METABOLIC PANEL
ALT: 42 IU/L (ref 0–44)
AST: 27 IU/L (ref 0–40)
Albumin/Globulin Ratio: 2 (ref 1.2–2.2)
Albumin: 4.6 g/dL (ref 3.8–4.9)
Alkaline Phosphatase: 48 IU/L (ref 39–117)
BUN/Creatinine Ratio: 22 — ABNORMAL HIGH (ref 9–20)
BUN: 22 mg/dL (ref 6–24)
Bilirubin Total: 1 mg/dL (ref 0.0–1.2)
CO2: 22 mmol/L (ref 20–29)
Calcium: 9.4 mg/dL (ref 8.7–10.2)
Chloride: 97 mmol/L (ref 96–106)
Creatinine, Ser: 1.02 mg/dL (ref 0.76–1.27)
GFR calc Af Amer: 93 mL/min/{1.73_m2} (ref >59)
GFR calc non Af Amer: 81 mL/min/{1.73_m2} (ref >59)
Globulin, Total: 2.3 g/dL (ref 1.5–4.5)
Glucose: 89 mg/dL (ref 65–99)
Potassium: 4.3 mmol/L (ref 3.5–5.2)
Sodium: 136 mmol/L (ref 134–144)
Total Protein: 6.9 g/dL (ref 6.0–8.5)

## 2020-01-13 LAB — LIPID PANEL
Chol/HDL Ratio: 2.8 ratio (ref 0.0–5.0)
Cholesterol, Total: 148 mg/dL (ref 100–199)
HDL: 52 mg/dL (ref >39)
LDL Chol Calc (NIH): 75 mg/dL (ref 0–99)
Triglycerides: 118 mg/dL (ref 0–149)
VLDL Cholesterol Cal: 21 mg/dL (ref 5–40)

## 2020-01-13 LAB — HEMOGLOBIN A1C
Est. average glucose Bld gHb Est-mCnc: 117 mg/dL
Hgb A1c MFr Bld: 5.7 % — ABNORMAL HIGH (ref 4.8–5.6)

## 2020-03-27 ENCOUNTER — Other Ambulatory Visit: Payer: Self-pay | Admitting: Interventional Cardiology

## 2020-09-27 ENCOUNTER — Encounter (HOSPITAL_COMMUNITY): Payer: Self-pay

## 2020-09-27 ENCOUNTER — Emergency Department (HOSPITAL_COMMUNITY): Payer: BC Managed Care – PPO

## 2020-09-27 ENCOUNTER — Other Ambulatory Visit: Payer: Self-pay

## 2020-09-27 ENCOUNTER — Emergency Department (HOSPITAL_COMMUNITY)
Admission: EM | Admit: 2020-09-27 | Discharge: 2020-09-27 | Disposition: A | Discharge status: Home / Self Care | Payer: BC Managed Care – PPO | Attending: Emergency Medicine | Admitting: Emergency Medicine

## 2020-09-27 DIAGNOSIS — F10230 Alcohol dependence with withdrawal, uncomplicated: Secondary | ICD-10-CM

## 2020-09-27 DIAGNOSIS — F10239 Alcohol dependence with withdrawal, unspecified: Secondary | ICD-10-CM | POA: Diagnosis not present

## 2020-09-27 DIAGNOSIS — I10 Essential (primary) hypertension: Secondary | ICD-10-CM | POA: Diagnosis not present

## 2020-09-27 DIAGNOSIS — Z79899 Other long term (current) drug therapy: Secondary | ICD-10-CM | POA: Diagnosis not present

## 2020-09-27 DIAGNOSIS — E871 Hypo-osmolality and hyponatremia: Secondary | ICD-10-CM | POA: Insufficient documentation

## 2020-09-27 DIAGNOSIS — F10930 Alcohol use, unspecified with withdrawal, uncomplicated: Secondary | ICD-10-CM

## 2020-09-27 DIAGNOSIS — R251 Tremor, unspecified: Secondary | ICD-10-CM | POA: Diagnosis present

## 2020-09-27 HISTORY — DX: Alcohol abuse, uncomplicated: F10.10

## 2020-09-27 LAB — CBC
HCT: 42 % (ref 39.0–52.0)
Hemoglobin: 14.9 g/dL (ref 13.0–17.0)
MCH: 32.3 pg (ref 26.0–34.0)
MCHC: 35.5 g/dL (ref 30.0–36.0)
MCV: 91.1 fL (ref 80.0–100.0)
Platelets: 244 10*3/uL (ref 150–400)
RBC: 4.61 MIL/uL (ref 4.22–5.81)
RDW: 12.8 % (ref 11.5–15.5)
WBC: 11.8 10*3/uL — ABNORMAL HIGH (ref 4.0–10.5)
nRBC: 0 % (ref 0.0–0.2)

## 2020-09-27 LAB — BASIC METABOLIC PANEL
Anion gap: 15 (ref 5–15)
BUN: 23 mg/dL — ABNORMAL HIGH (ref 6–20)
CO2: 22 mmol/L (ref 22–32)
Calcium: 9.2 mg/dL (ref 8.9–10.3)
Chloride: 86 mmol/L — ABNORMAL LOW (ref 98–111)
Creatinine, Ser: 1.15 mg/dL (ref 0.61–1.24)
GFR, Estimated: >60 mL/min (ref >60)
Glucose, Bld: 107 mg/dL — ABNORMAL HIGH (ref 70–99)
Potassium: 3.9 mmol/L (ref 3.5–5.1)
Sodium: 123 mmol/L — ABNORMAL LOW (ref 135–145)

## 2020-09-27 LAB — MAGNESIUM: Magnesium: 2 mg/dL (ref 1.7–2.4)

## 2020-09-27 MED ORDER — FAMOTIDINE 20 MG PO TABS: 20.0000 mg | ORAL_TABLET | Freq: Two times a day (BID) | ORAL | 1 refills | Status: AC

## 2020-09-27 MED ORDER — FAMOTIDINE 20 MG PO TABS
40.0000 mg | ORAL_TABLET | Freq: Once | ORAL | Status: AC
  Administered 2020-09-27: 40 mg via ORAL
  Filled 2020-09-27: qty 2

## 2020-09-27 MED ORDER — CHLORDIAZEPOXIDE HCL 25 MG PO CAPS
50.0000 mg | ORAL_CAPSULE | Freq: Once | ORAL | Status: AC
  Administered 2020-09-27: 50 mg via ORAL
  Filled 2020-09-27: qty 2

## 2020-09-27 MED ORDER — ONDANSETRON 4 MG PO TBDP
4.0000 mg | ORAL_TABLET | Freq: Once | ORAL | Status: AC
  Administered 2020-09-27: 4 mg via ORAL
  Filled 2020-09-27: qty 1

## 2020-09-27 MED ORDER — CHLORDIAZEPOXIDE HCL 25 MG PO CAPS
75.0000 mg | ORAL_CAPSULE | Freq: Once | ORAL | Status: AC
  Administered 2020-09-27: 75 mg via ORAL
  Filled 2020-09-27: qty 3

## 2020-09-27 MED ORDER — ONDANSETRON 4 MG PO TBDP: 4.0000 mg | ORAL_TABLET | Freq: Three times a day (TID) | ORAL | 0 refills | Status: DC | PRN

## 2020-09-27 MED ORDER — CHLORDIAZEPOXIDE HCL 25 MG PO CAPS: ORAL_CAPSULE | ORAL | 0 refills | Status: DC

## 2020-09-27 NOTE — ED Notes (Signed)
Patient c/o chest pain since being seen in triage.

## 2020-09-27 NOTE — ED Provider Notes (Signed)
[facility]-EMERGENCY DEPT Provider Note   CSN: [id number] Arrival date & time: 09/27/20  5638     History Chief Complaint  Patient presents with  . alcohol withdrawal symptoms    Randie Tallarico is a 60 y.o. male history hypertension, reflux, hypercholesterol, chronic alcohol use, present emergency department concern for alcohol withdrawal.  Patient reports he is struggled with alcohol use disorder for many years.  He stopped drinking yesterday, his last drink in 11 AM.  Prior to that he been drinking bourbon daily.  He reports he began feeling tremulous and went to [facility].  There he was started on Librium 10 mg tablets to take once every 4-6 hours as needed.  He states that overnight he began having worsening tremors and heart racing and difficult time sleeping.  He called back the center this morning was told to double his dose and then come to the ER if he was no better.  He presents to the ED feeling tremulous.  He continues to have some palpitations.  He says he is having indigestion, and says this is very common when he is withdrawing.  He said a very poor appetite.  He describes a reflux heartburn sensation in his epigastrium radiating up the center of his chest towards his throat.  He denies left-sided chest pain or pressure.  He denies any history of MI or cardiac disease.  He denies any history of alcohol withdrawal seizures.  He lives with his wife and daughter at home.  He is already plugged into AA and is planning to attend a group meeting tomorrow at the [facility].  HPI     Past Medical History:  Diagnosis Date  . Alcohol abuse   . Anxiety   . GERD (gastroesophageal reflux disease)   . Hypercholesteremia   . Hypertension   . SVT (supraventricular tachycardia) [facility])     Patient Active Problem List   Diagnosis Date Noted  . Obesity 03/14/2014  . H/O ETOH abuse 02/18/2012  . Essential hypertension, benign 02/18/2012  . Alcohol  consumption heavy 02/18/2012  . Hypercholesteremia   . SVT (supraventricular tachycardia) (HCC)     Past Surgical History:  Procedure Laterality Date  . COLONOSCOPY    . RADIOFREQUENCY ABLATION     for SVT (01/2011)       Family History  Problem Relation Age of Onset  . Anxiety disorder Mother   . Hypertension Mother   . Heart attack Father   . Heart attack Maternal Grandfather   . Stroke Neg Hx     Social History   Tobacco Use  . Smoking status: Never Smoker  . Smokeless tobacco: Never Used  Vaping Use  . Vaping Use: Never used  Substance Use Topics  . Alcohol use: Yes    Comment: heavy drinker  . Drug use: No    Home Medications Prior to Admission medications   Medication Sig Start Date End Date Taking? Authorizing Provider  chlordiazePOXIDE (LIBRIUM) 25 MG capsule 50mg  PO TID x 1D, then 25-50mg  PO BID X 1D, then 25-50mg  PO QD X 1D 09/27/20  Yes Pistol Kessenich, 09/29/20, MD  famotidine (PEPCID) 20 MG tablet Take 1 tablet (20 mg total) by mouth 2 (two) times daily. 09/27/20 10/27/20 Yes Glennie Rodda, 12/25/20, MD  ondansetron (ZOFRAN ODT) 4 MG disintegrating tablet Take 1 tablet (4 mg total) by mouth every 8 (eight) hours as needed for up to 15 doses for nausea or vomiting. 09/27/20  Yes 09/29/20  J, MD  lisinopril (ZESTRIL) 40 MG tablet Take 1 tablet (40 mg total) by mouth daily. 01/12/20   [name], MD  metoprolol succinate (TOPROL-XL) 50 MG 24 hr tablet Take 1 tablet (50 mg total) by mouth daily. Take with or immediately following a meal. 01/12/20   [name], MD  simvastatin (ZOCOR) 40 MG tablet TAKE ONE TABLET BY MOUTH DAILY AT [facility] 01/12/20   [name], MD    Allergies    Patient has no known allergies.  Review of Systems   Review of Systems  Constitutional: Negative for chills and fever.  Eyes: Negative for pain and visual disturbance.  Respiratory: Negative for cough and shortness of breath.   Cardiovascular: Positive for  palpitations. Negative for chest pain.  Gastrointestinal: Negative for abdominal pain and vomiting.  Genitourinary: Negative for dysuria and hematuria.  Musculoskeletal: Positive for myalgias. Negative for arthralgias.  Skin: Negative for color change and rash.  Neurological: Negative for syncope and light-headedness.  Psychiatric/Behavioral: Positive for agitation and sleep disturbance. The patient is nervous/anxious.   All other systems reviewed and are negative.   Physical Exam Updated Vital Signs BP 120/90   Pulse (!) 103   Temp 97.9 F (36.6 C) (Oral)   Resp 19   Ht 5\' 9"  (1.753 m)   Wt 108.9 kg   SpO2 98%   BMI 35.44 kg/m   Physical Exam Constitutional:      General: He is not in acute distress. HENT:     Head: Normocephalic and atraumatic.  Eyes:     Conjunctiva/sclera: Conjunctivae normal.     Pupils: Pupils are equal, round, and reactive to light.  Cardiovascular:     Rate and Rhythm: Normal rate and regular rhythm.  Pulmonary:     Effort: Pulmonary effort is normal. No respiratory distress.  Skin:    General: Skin is warm and dry.  Neurological:     General: No focal deficit present.     Mental Status: He is alert. Mental status is at baseline.     Comments: Minor resting tremor bilateral hands No tongue fasciculations AAO x 4, good mentation  Psychiatric:        Mood and Affect: Mood normal.        Behavior: Behavior normal.     ED Results / Procedures / Treatments   Labs (all labs ordered are listed, but only abnormal results are displayed) Labs Reviewed  BASIC METABOLIC PANEL - Abnormal; Notable for the following components:      Result Value   Sodium 123 (*)    Chloride 86 (*)    Glucose, Bld 107 (*)    BUN 23 (*)    All other components within normal limits  CBC - Abnormal; Notable for the following components:   WBC 11.8 (*)    All other components within normal limits  MAGNESIUM    EKG EKG Interpretation  Date/Time:  Thursday  September 27 2020 12:02:41 EST Ventricular Rate:  104 PR Interval:    QRS Duration: 88 QT Interval:  332 QTC Calculation: 437 R Axis:   6 Text Interpretation: Sinus tachycardia Low voltage, precordial leads Abnormal R-wave progression, early transition 12 Lead; Mason-Likar No STEMI Confirmed by [birth year]1991 [id number]) on 09/27/2020 1:12:08 PM   Radiology DG Chest 2 View  Result Date: 09/27/2020 CLINICAL DATA:  Chest pain EXAM: CHEST - 2 VIEW COMPARISON:  February 18, 2012 FINDINGS: Lungs are clear. Heart size and pulmonary vascularity are normal. No  adenopathy. No pneumothorax. No bone lesions. IMPRESSION: Lungs clear.  Cardiac silhouette normal. Electronically Signed   By: Bretta Bang III M.D.   On: 09/27/2020 14:14    Procedures Procedures (including critical care time)  Medications Ordered in ED Medications  chlordiazePOXIDE (LIBRIUM) capsule 75 mg (75 mg Oral Given 09/27/20 1345)  famotidine (PEPCID) tablet 40 mg (40 mg Oral Given 09/27/20 1345)  ondansetron (ZOFRAN-ODT) disintegrating tablet 4 mg (4 mg Oral Given 09/27/20 1345)  chlordiazePOXIDE (LIBRIUM) capsule 50 mg (50 mg Oral Given 09/27/20 1548)    ED Course  I have reviewed the triage vital signs and the nursing notes.  Pertinent labs & imaging results that were available during my care of the patient were reviewed by me and considered in my medical decision making (see chart for details).  60 yo male here with etoh withdrawal, day 2. He has not had any blood work done, we can check his electrolyte levels here. He had an EKG performed on her arrival in triage due to complaint of chest pain.  EKG shows a normal sinus rhythm with no acute ischemia.  On further questioning and during the clinical exam, it appears that his chest pain is in fact significant reflux, consistent with his withdrawal.  I do not believe this is ACS.  We can treat the reflux with Pepcid.  We can also give him some Zofran for nausea.  I suspect he may  need to be on a higher dose of Librium and 10 mg.  We can give him a higher dose here in the ED, then I will prescribe him a slightly higher dose as a taper.  Low CIWA score at this time.  Labs reviewed - mg normal, cbc unremarkable, BMP with some hyponatremia likely 2/2 dietary deficiency DG chest reviewed with no focal findings per my interpretation ECG personally reviewed with no acute ischemic findings  Clinical Course as of 09/27/20 1836  Thu Sep 27, 2020  1525 Pt and daughter at bedside updated regarding labs, hyponatremia likely 2/2 poor appetite.  Encouraged eating saltines, soup this week, then eating refularly after, and having his doctor recheck his levels.  Verbalized understanding. [MT]    Clinical Course User Index [MT] Lorma Heater, Kermit Balo, MD    Final Clinical Impression(s) / ED Diagnoses Final diagnoses:  Alcohol withdrawal syndrome without complication (HCC)  Hyponatremia    Rx / DC Orders ED Discharge Orders         Ordered    chlordiazePOXIDE (LIBRIUM) 25 MG capsule        09/27/20 1528    famotidine (PEPCID) 20 MG tablet  2 times daily        09/27/20 1528    ondansetron (ZOFRAN ODT) 4 MG disintegrating tablet  Every 8 hours PRN        09/27/20 1528           Terald Sleeper, MD 09/27/20 1836

## 2020-09-27 NOTE — Discharge Instructions (Signed)
We talked about your low salt level today.  This is likely because of your poor appetite.  Please make an effort to eat saltines and soup for the next week, continue drinking water as normal, and talk to your doctor about rechecking your sodium level in 1 to 2 weeks.  Doctor may also want to check your liver enzymes at that time.  We talked about your chronic drinking, and I explained that the best evaluation of your liver is with an outpatient ultrasound of the liver.

## 2020-09-27 NOTE — ED Triage Notes (Signed)
Patient states he is having alcohol withdrawal. Patient was seen at the [facility] and prescribed Chlordiazepoxide yesterday.  Patient c/o tremors and heart racing.([id number])

## 2020-12-24 ENCOUNTER — Other Ambulatory Visit: Payer: Self-pay | Admitting: Interventional Cardiology

## 2021-02-21 NOTE — Progress Notes (Signed)
Cardiology Office Note   Date:  02/22/2021   ID:  Diontae Route, DOB 24-Jan-1961, MRN [id number]  PCP:  Catha Gosselin, MD    No chief complaint on file.  SVT  Wt Readings from Last 3 Encounters:  02/22/21 260 lb (117.9 kg)  09/27/20 240 lb (108.9 kg)  01/12/20 268 lb 12.8 oz (121.9 kg)       History of Present Illness: Sean Booth is a 60 y.o. male  who has had issues with alcohol and had tachycardia in the past. He had an SVT ablation in 2008. He had some tachycardia after. He was given a prescription for a beta blocker.    His PMD thought it was panic attacks/anxiety. He was on Zoloft and Xanax in the past.   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  He works for the [employer]). He has had less stress at work with the current job. He had stopped drinking and was going to [employer]. He restarted moderate alcohol. He then cut back to one beer every few weeks.      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  This slowed down his exercise.      Wife had CHF and was in the hospital, and will need an AICD. 36 year old  Daughter had DRES syndrome- allergic response to medicine.  He continues to have difficulty losing weight and keeping it off.  Not walking much.    Had an issue with alcohol withdrawal sx in Jan 2022.  He had COVID in 2022 Jan, along with his wife.   Past Medical History:  Diagnosis Date   Alcohol abuse    Anxiety    GERD (gastroesophageal reflux disease)    Hypercholesteremia    Hypertension    SVT (supraventricular tachycardia) (HCC)     Past Surgical History:  Procedure Laterality Date   COLONOSCOPY     RADIOFREQUENCY ABLATION     for SVT (01/2011)     Current Outpatient Medications  Medication Sig Dispense Refill   famotidine (PEPCID) 20 MG tablet Take 1 tablet (20 mg total) by mouth 2 (two) times daily. 30 tablet 1   lisinopril (ZESTRIL) 40 MG tablet Take 1 tablet (40 mg total) by  mouth daily. It is time for your yearly appointment. Please schedule appointment for future refills. Thank you 90 tablet 0   metoprolol succinate (TOPROL-XL) 50 MG 24 hr tablet Take 1 tablet (50 mg total) by mouth daily. Take with or immediately following a meal. 90 tablet 3   simvastatin (ZOCOR) 40 MG tablet TAKE ONE TABLET BY MOUTH DAILY AT 6PM 90 tablet 3   No current facility-administered medications for this visit.    Allergies:   Patient has no known allergies.    Social History:  The patient  reports that he has never smoked. He has never used smokeless tobacco. He reports current alcohol use. He reports that he does not use drugs.   Family History:  The patient's family history includes Anxiety disorder in his mother; Heart attack in his father and maternal grandfather; Hypertension in his mother.    ROS:  Please see the history of present illness.   Otherwise, review of systems are positive for weight gain.   All other systems are reviewed and negative.    PHYSICAL EXAM: VS:  BP 130/80   Pulse 79   Ht 5\' 9"  (1.753 m)  Wt 260 lb (117.9 kg)   SpO2 96%   BMI 38.40 kg/m  , BMI Body mass index is 38.4 kg/m. GEN: Well nourished, well developed, in no acute distress HEENT: normal Neck: no JVD, carotid bruits, or masses Cardiac: RRR; no murmurs, rubs, or gallops,no edema  Respiratory:  clear to auscultation bilaterally, normal work of breathing GI: soft, nontender, nondistended, + BS, obese MS: no deformity or atrophy Skin: warm and dry, no rash Neuro:  Strength and sensation are intact Psych: euthymic mood, full affect   EKG:   The ekg ordered today demonstrates NSR, no ST changes   Recent Labs: 09/27/2020: BUN 23; Creatinine, Ser 1.15; Hemoglobin 14.9; Magnesium 2.0; Platelets 244; Potassium 3.9; Sodium 123   Lipid Panel    Component Value Date/Time   CHOL 148 01/12/2020 1603   TRIG 118 01/12/2020 1603   HDL 52 01/12/2020 1603   CHOLHDL 2.8 01/12/2020 1603    CHOLHDL 5.0 (H) 07/29/2016 0924   VLDL 18 07/29/2016 0924   LDLCALC 75 01/12/2020 1603     Other studies Reviewed: Additional studies/ records that were reviewed today with results demonstrating: LDL 75 in 2021.   ASSESSMENT AND PLAN:  SVT: s/p ablation.  No palpitations.  Continue beta blocker.   HTN: The current medical regimen is effective;  continue present plan and medications. Hyperlipidemia: simvastatin for lipid control.  Will refill. Check labs in a few months.   Obesity: Whole food, plant based diet. Avoiding alcohol will help.  He does not eat sweets.  H/o EtOH abuse: Trying to limit intake. Wife quit completely.    Current medicines are reviewed at length with the patient today.  The patient concerns regarding his medicines were addressed.  The following changes have been made:  No change  Labs/ tests ordered today include:  No orders of the defined types were placed in this encounter.   Recommend 150 minutes/week of aerobic exercise Low fat, low carb, high fiber diet recommended  Disposition:   FU in 1 year   Signed, Lance Muss, MD  02/22/2021 11:53 AM    [facility] [address] [place], [place], [place]  [address] Phone: [phone]; Fax: [phone]

## 2021-02-22 ENCOUNTER — Ambulatory Visit: Payer: BC Managed Care – PPO | Admitting: Interventional Cardiology

## 2021-02-22 ENCOUNTER — Other Ambulatory Visit: Payer: Self-pay

## 2021-02-22 ENCOUNTER — Encounter: Payer: Self-pay | Admitting: Interventional Cardiology

## 2021-02-22 VITALS — BP 130/80 | HR 79 | Ht 69.0 in | Wt 260.0 lb

## 2021-02-22 DIAGNOSIS — F1011 Alcohol abuse, in remission: Secondary | ICD-10-CM

## 2021-02-22 DIAGNOSIS — R7301 Impaired fasting glucose: Secondary | ICD-10-CM

## 2021-02-22 DIAGNOSIS — E7800 Pure hypercholesterolemia, unspecified: Secondary | ICD-10-CM | POA: Diagnosis not present

## 2021-02-22 DIAGNOSIS — I10 Essential (primary) hypertension: Secondary | ICD-10-CM

## 2021-02-22 DIAGNOSIS — I471 Supraventricular tachycardia: Secondary | ICD-10-CM | POA: Diagnosis not present

## 2021-02-22 MED ORDER — SIMVASTATIN 40 MG PO TABS: ORAL_TABLET | ORAL | 3 refills | Status: DC

## 2021-02-22 NOTE — Patient Instructions (Signed)
Your physician recommends that you continue on your current medications as directed. Please refer to the Current Medication list given to you today.  *If you need a refill on your cardiac medications before your next appointment, please call your pharmacy*   Lab Work: Your physician recommends that you return for lab work on August 26,2022.  CMET and Lipid profile. This will be fasting.  The lab opens at 7:30 AM  If you have labs (blood work) drawn today and your tests are completely normal, you will receive your results only by: MyChart Message (if you have MyChart) OR A paper copy in the mail If you have any lab test that is abnormal or we need to change your treatment, we will call you to review the results.   Testing/Procedures: none   Follow-Up: At [facility], you and your health needs are our priority.  As part of our continuing mission to provide you with exceptional heart care, we have created designated Provider Care Teams.  These Care Teams include your primary Cardiologist (physician) and Advanced Practice Providers (APPs -  Physician Assistants and Nurse Practitioners) who all work together to provide you with the care you need, when you need it.  We recommend signing up for the patient portal called "MyChart".  Sign up information is provided on this After Visit Summary.  MyChart is used to connect with patients for Virtual Visits (Telemedicine).  Patients are able to view lab/test results, encounter notes, upcoming appointments, etc.  Non-urgent messages can be sent to your provider as well.   To learn more about what you can do with MyChart, go to [email or web address].    Your next appointment:   12 month(s)  The format for your next appointment:   In Person  Provider:   You may see Lance Muss, MD or one of the following Advanced Practice Providers on your designated Care Team:   Ronie Spies, PA-C Jacolyn Reedy, PA-C   Other Instructions

## 2021-03-02 ENCOUNTER — Other Ambulatory Visit: Payer: Self-pay | Admitting: Interventional Cardiology

## 2021-05-10 ENCOUNTER — Other Ambulatory Visit: Payer: BC Managed Care – PPO | Admitting: *Deleted

## 2021-05-10 ENCOUNTER — Encounter (INDEPENDENT_AMBULATORY_CARE_PROVIDER_SITE_OTHER): Payer: Self-pay

## 2021-05-10 ENCOUNTER — Other Ambulatory Visit: Payer: Self-pay

## 2021-05-10 DIAGNOSIS — R7301 Impaired fasting glucose: Secondary | ICD-10-CM

## 2021-05-10 DIAGNOSIS — E7800 Pure hypercholesterolemia, unspecified: Secondary | ICD-10-CM

## 2021-05-10 DIAGNOSIS — I10 Essential (primary) hypertension: Secondary | ICD-10-CM

## 2021-05-10 DIAGNOSIS — I471 Supraventricular tachycardia: Secondary | ICD-10-CM

## 2021-05-10 LAB — COMPREHENSIVE METABOLIC PANEL
ALT: 26 IU/L (ref 0–44)
AST: 12 IU/L (ref 0–40)
Albumin/Globulin Ratio: 3.1 — ABNORMAL HIGH (ref 1.2–2.2)
Albumin: 5 g/dL — ABNORMAL HIGH (ref 3.8–4.9)
Alkaline Phosphatase: 49 IU/L (ref 44–121)
BUN/Creatinine Ratio: 16 (ref 10–24)
BUN: 18 mg/dL (ref 8–27)
Bilirubin Total: 0.8 mg/dL (ref 0.0–1.2)
CO2: 22 mmol/L (ref 20–29)
Calcium: 9.2 mg/dL (ref 8.6–10.2)
Chloride: 94 mmol/L — ABNORMAL LOW (ref 96–106)
Creatinine, Ser: 1.12 mg/dL (ref 0.76–1.27)
Globulin, Total: 1.6 g/dL (ref 1.5–4.5)
Glucose: 108 mg/dL — ABNORMAL HIGH (ref 65–99)
Potassium: 4.6 mmol/L (ref 3.5–5.2)
Sodium: 132 mmol/L — ABNORMAL LOW (ref 134–144)
Total Protein: 6.6 g/dL (ref 6.0–8.5)
eGFR: 75 mL/min/{1.73_m2} (ref >59)

## 2021-05-10 LAB — LIPID PANEL
Chol/HDL Ratio: 3.6 ratio (ref 0.0–5.0)
Cholesterol, Total: 155 mg/dL (ref 100–199)
HDL: 43 mg/dL (ref >39)
LDL Chol Calc (NIH): 98 mg/dL (ref 0–99)
Triglycerides: 74 mg/dL (ref 0–149)
VLDL Cholesterol Cal: 14 mg/dL (ref 5–40)

## 2021-05-17 ENCOUNTER — Telehealth: Payer: Self-pay | Admitting: *Deleted

## 2021-05-17 DIAGNOSIS — E7800 Pure hypercholesterolemia, unspecified: Secondary | ICD-10-CM

## 2021-05-17 MED ORDER — ROSUVASTATIN CALCIUM 20 MG PO TABS: 20.0000 mg | ORAL_TABLET | Freq: Every day | ORAL | 3 refills | Status: DC

## 2021-05-17 NOTE — Telephone Encounter (Signed)
-----   Message from [name], MD sent at 05/13/2021  9:23 PM EDT ----- LDL ok, but increased from last year. Healthy diet. Simvastatin may not be working as well.  Would change simvastatin to rosuvastatin 20 mg daily to see if LDL improves.  LFTs and lipids in 3 months

## 2021-05-17 NOTE — Telephone Encounter (Signed)
Patient notified.  Prescription sent to [facility] on [address].  He will come in for fasting lab work on December 2,2022

## 2021-08-16 ENCOUNTER — Other Ambulatory Visit: Payer: Self-pay

## 2021-08-16 ENCOUNTER — Other Ambulatory Visit: Payer: BC Managed Care – PPO | Admitting: *Deleted

## 2021-08-16 DIAGNOSIS — E7800 Pure hypercholesterolemia, unspecified: Secondary | ICD-10-CM

## 2021-08-16 LAB — LIPID PANEL
Chol/HDL Ratio: 2.9 ratio (ref 0.0–5.0)
Cholesterol, Total: 125 mg/dL (ref 100–199)
HDL: 43 mg/dL (ref >39)
LDL Chol Calc (NIH): 65 mg/dL (ref 0–99)
Triglycerides: 90 mg/dL (ref 0–149)
VLDL Cholesterol Cal: 17 mg/dL (ref 5–40)

## 2021-08-16 LAB — HEPATIC FUNCTION PANEL
ALT: 28 IU/L (ref 0–44)
AST: 20 IU/L (ref 0–40)
Albumin: 4.7 g/dL (ref 3.8–4.9)
Alkaline Phosphatase: 48 IU/L (ref 44–121)
Bilirubin Total: 0.8 mg/dL (ref 0.0–1.2)
Bilirubin, Direct: 0.23 mg/dL (ref 0.00–0.40)
Total Protein: 6.8 g/dL (ref 6.0–8.5)

## 2022-03-10 ENCOUNTER — Other Ambulatory Visit: Payer: Self-pay

## 2022-03-10 MED ORDER — METOPROLOL SUCCINATE ER 50 MG PO TB24: ORAL_TABLET | ORAL | 0 refills | Status: DC

## 2022-04-06 ENCOUNTER — Other Ambulatory Visit: Payer: Self-pay | Admitting: Interventional Cardiology

## 2022-04-08 ENCOUNTER — Other Ambulatory Visit: Payer: Self-pay

## 2022-04-08 MED ORDER — LISINOPRIL 40 MG PO TABS: 40.0000 mg | ORAL_TABLET | Freq: Every day | ORAL | 0 refills | Status: DC

## 2022-05-15 ENCOUNTER — Other Ambulatory Visit: Payer: Self-pay | Admitting: Interventional Cardiology

## 2022-05-21 ENCOUNTER — Telehealth: Payer: Self-pay | Admitting: Interventional Cardiology

## 2022-05-21 MED ORDER — METOPROLOL SUCCINATE ER 50 MG PO TB24
ORAL_TABLET | ORAL | 1 refills | Status: DC
Start: 2022-05-21 — End: 2022-07-01

## 2022-05-21 MED ORDER — LISINOPRIL 40 MG PO TABS: 40.0000 mg | ORAL_TABLET | Freq: Every day | ORAL | 1 refills | Status: DC

## 2022-05-21 NOTE — Telephone Encounter (Signed)
Pt's medications were sent to pt's pharmacy as requested. Confirmation received.  

## 2022-05-21 NOTE — Telephone Encounter (Signed)
 *  STAT* If patient is at the pharmacy, call can be transferred to refill team.   1. Which medications need to be refilled? (please list name of each medication and dose if known) lisinopril (ZESTRIL) 40 MG tablet metoprolol succinate (TOPROL-XL) 50 MG 24 hr tablet  2. Which pharmacy/location (including street and city if local pharmacy) is medication to be sent to?[facility] [id number] - [place], [place] - [address]  3. Do they need a 30 day or 90 day supply? 90 days

## 2022-06-15 IMAGING — CR DG CHEST 2V
2 series · 2 of 2 positions shown · non-contrast
Comparison: February 18, 2012

CLINICAL DATA: Chest pain

EXAM:
CHEST - 2 VIEW

[w chest pa]
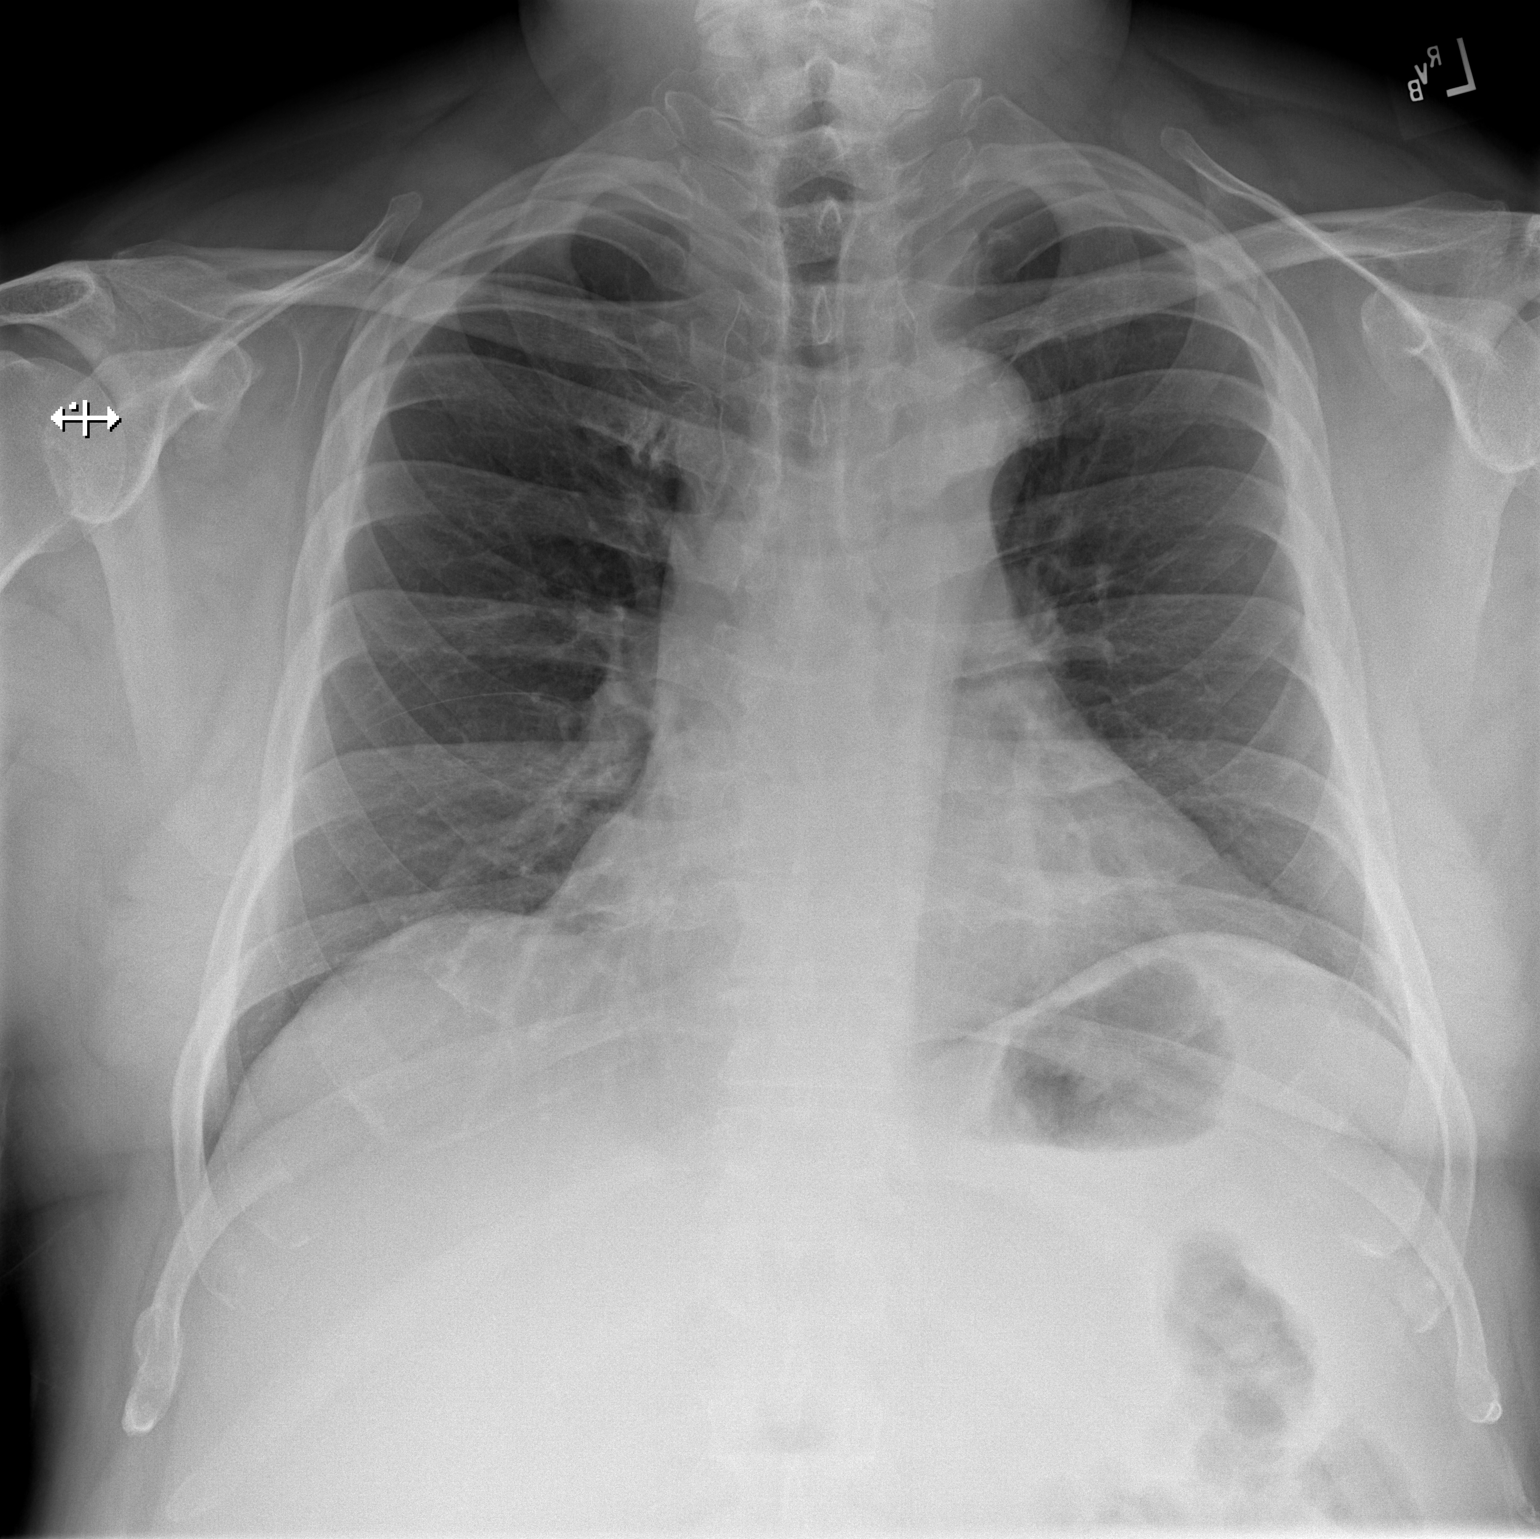

[w chest lat]
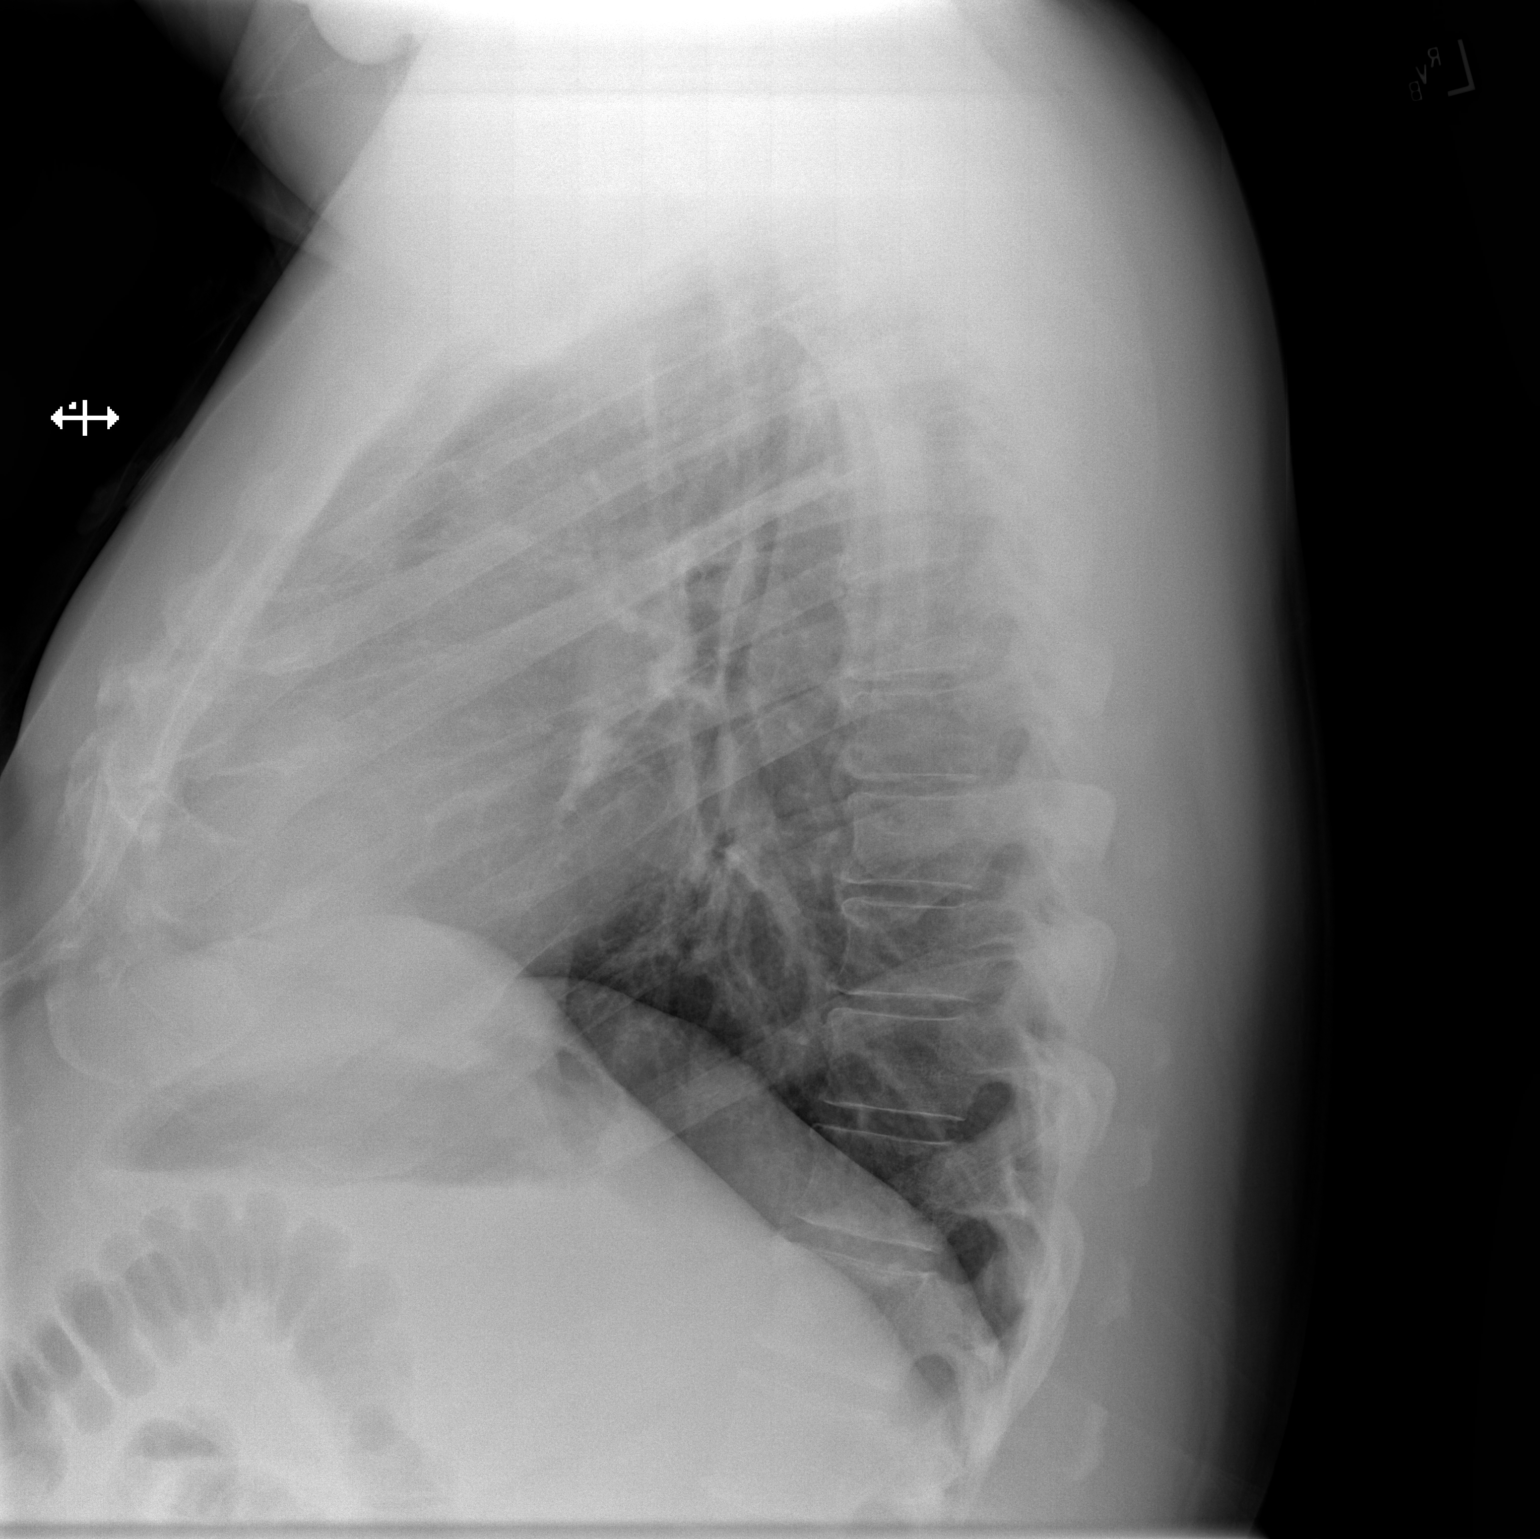

[2 of 2 positions shown; findings below may reference images not displayed]

FINDINGS: Lungs are clear. Heart size and pulmonary vascularity are normal. No
adenopathy. No pneumothorax. No bone lesions.
IMPRESSION: Lungs clear.  Cardiac silhouette normal.

## 2022-06-30 ENCOUNTER — Emergency Department (HOSPITAL_BASED_OUTPATIENT_CLINIC_OR_DEPARTMENT_OTHER)
Admission: EM | Admit: 2022-06-30 | Discharge: 2022-06-30 | Disposition: A | Discharge status: Home / Self Care | Payer: BC Managed Care – PPO | Attending: Emergency Medicine | Admitting: Emergency Medicine

## 2022-06-30 ENCOUNTER — Encounter (HOSPITAL_BASED_OUTPATIENT_CLINIC_OR_DEPARTMENT_OTHER): Payer: Self-pay | Admitting: Emergency Medicine

## 2022-06-30 ENCOUNTER — Other Ambulatory Visit: Payer: Self-pay

## 2022-06-30 DIAGNOSIS — F10130 Alcohol abuse with withdrawal, uncomplicated: Secondary | ICD-10-CM | POA: Diagnosis present

## 2022-06-30 DIAGNOSIS — F10930 Alcohol use, unspecified with withdrawal, uncomplicated: Secondary | ICD-10-CM

## 2022-06-30 DIAGNOSIS — R000 Tachycardia, unspecified: Secondary | ICD-10-CM | POA: Insufficient documentation

## 2022-06-30 DIAGNOSIS — I10 Essential (primary) hypertension: Secondary | ICD-10-CM | POA: Insufficient documentation

## 2022-06-30 DIAGNOSIS — Y906 Blood alcohol level of 120-199 mg/100 ml: Secondary | ICD-10-CM | POA: Insufficient documentation

## 2022-06-30 DIAGNOSIS — Z79899 Other long term (current) drug therapy: Secondary | ICD-10-CM | POA: Diagnosis not present

## 2022-06-30 LAB — COMPREHENSIVE METABOLIC PANEL
ALT: 39 U/L (ref 0–44)
AST: 57 U/L — ABNORMAL HIGH (ref 15–41)
Albumin: 4.3 g/dL (ref 3.5–5.0)
Alkaline Phosphatase: 49 U/L (ref 38–126)
Anion gap: 12 (ref 5–15)
BUN: 18 mg/dL (ref 8–23)
CO2: 24 mmol/L (ref 22–32)
Calcium: 8.6 mg/dL — ABNORMAL LOW (ref 8.9–10.3)
Chloride: 93 mmol/L — ABNORMAL LOW (ref 98–111)
Creatinine, Ser: 1.15 mg/dL (ref 0.61–1.24)
GFR, Estimated: >60 mL/min (ref >60)
Glucose, Bld: 112 mg/dL — ABNORMAL HIGH (ref 70–99)
Potassium: 3.8 mmol/L (ref 3.5–5.1)
Sodium: 129 mmol/L — ABNORMAL LOW (ref 135–145)
Total Bilirubin: 2.2 mg/dL — ABNORMAL HIGH (ref 0.3–1.2)
Total Protein: 6.6 g/dL (ref 6.5–8.1)

## 2022-06-30 LAB — CBC WITH DIFFERENTIAL/PLATELET
Abs Immature Granulocytes: 0.01 10*3/uL (ref 0.00–0.07)
Basophils Absolute: 0 10*3/uL (ref 0.0–0.1)
Basophils Relative: 0 %
Eosinophils Absolute: 0.1 10*3/uL (ref 0.0–0.5)
Eosinophils Relative: 1 %
HCT: 45.8 % (ref 39.0–52.0)
Hemoglobin: 15.8 g/dL (ref 13.0–17.0)
Immature Granulocytes: 0 %
Lymphocytes Relative: 32 %
Lymphs Abs: 2.2 10*3/uL (ref 0.7–4.0)
MCH: 30.2 pg (ref 26.0–34.0)
MCHC: 34.5 g/dL (ref 30.0–36.0)
MCV: 87.6 fL (ref 80.0–100.0)
Monocytes Absolute: 0.4 10*3/uL (ref 0.1–1.0)
Monocytes Relative: 5 %
Neutro Abs: 4.2 10*3/uL (ref 1.7–7.7)
Neutrophils Relative %: 62 %
Platelets: 273 10*3/uL (ref 150–400)
RBC: 5.23 MIL/uL (ref 4.22–5.81)
RDW: 13.5 % (ref 11.5–15.5)
WBC: 6.8 10*3/uL (ref 4.0–10.5)
nRBC: 0 % (ref 0.0–0.2)

## 2022-06-30 LAB — RAPID URINE DRUG SCREEN, HOSP PERFORMED
Amphetamines: NOT DETECTED
Barbiturates: NOT DETECTED
Benzodiazepines: NOT DETECTED
Cocaine: NOT DETECTED
Opiates: NOT DETECTED
Tetrahydrocannabinol: NOT DETECTED

## 2022-06-30 LAB — URINALYSIS, ROUTINE W REFLEX MICROSCOPIC
Bilirubin Urine: NEGATIVE
Glucose, UA: NEGATIVE mg/dL
Hgb urine dipstick: NEGATIVE
Ketones, ur: NEGATIVE mg/dL
Leukocytes,Ua: NEGATIVE
Nitrite: NEGATIVE
Protein, ur: NEGATIVE mg/dL
Specific Gravity, Urine: <1.005 — ABNORMAL LOW (ref 1.005–1.030)
pH: 5.5 (ref 5.0–8.0)

## 2022-06-30 LAB — ETHANOL: Alcohol, Ethyl (B): 131 mg/dL — ABNORMAL HIGH (ref <10)

## 2022-06-30 MED ORDER — METOPROLOL SUCCINATE ER 25 MG PO TB24
50.0000 mg | ORAL_TABLET | Freq: Once | ORAL | Status: AC
  Administered 2022-06-30: 50 mg via ORAL
  Filled 2022-06-30: qty 2

## 2022-06-30 MED ORDER — ADULT MULTIVITAMIN W/MINERALS CH
1.0000 | ORAL_TABLET | Freq: Every day | ORAL | Status: DC
  Administered 2022-06-30: 1 via ORAL
  Filled 2022-06-30: qty 1

## 2022-06-30 MED ORDER — SODIUM CHLORIDE 0.9 % IV BOLUS
1000.0000 mL | Freq: Once | INTRAVENOUS | Status: AC
  Administered 2022-06-30: 1000 mL via INTRAVENOUS

## 2022-06-30 MED ORDER — LISINOPRIL 10 MG PO TABS
40.0000 mg | ORAL_TABLET | Freq: Every day | ORAL | Status: DC
  Administered 2022-06-30: 40 mg via ORAL
  Filled 2022-06-30: qty 4

## 2022-06-30 MED ORDER — LORAZEPAM 2 MG/ML IJ SOLN
1.0000 mg | INTRAMUSCULAR | Status: DC | PRN
  Administered 2022-06-30: 2 mg via INTRAVENOUS
  Filled 2022-06-30: qty 1

## 2022-06-30 MED ORDER — CHLORDIAZEPOXIDE HCL 25 MG PO CAPS: ORAL_CAPSULE | ORAL | 0 refills | Status: DC

## 2022-06-30 MED ORDER — THIAMINE MONONITRATE 100 MG PO TABS
100.0000 mg | ORAL_TABLET | Freq: Every day | ORAL | Status: DC
  Administered 2022-06-30: 100 mg via ORAL
  Filled 2022-06-30: qty 1

## 2022-06-30 MED ORDER — FOLIC ACID 1 MG PO TABS
1.0000 mg | ORAL_TABLET | Freq: Every day | ORAL | Status: DC
  Administered 2022-06-30: 1 mg via ORAL
  Filled 2022-06-30: qty 1

## 2022-06-30 MED ORDER — LORAZEPAM 1 MG PO TABS
1.0000 mg | ORAL_TABLET | ORAL | Status: DC | PRN
  Administered 2022-06-30: 1 mg via ORAL
  Filled 2022-06-30: qty 1

## 2022-06-30 MED ORDER — ROSUVASTATIN CALCIUM 20 MG PO TABS
20.0000 mg | ORAL_TABLET | Freq: Every day | ORAL | Status: DC
  Filled 2022-06-30: qty 1

## 2022-06-30 MED ORDER — THIAMINE HCL 100 MG/ML IJ SOLN
100.0000 mg | Freq: Every day | INTRAMUSCULAR | Status: DC
  Filled 2022-06-30: qty 2

## 2022-06-30 NOTE — ED Triage Notes (Signed)
States he drinks too much and would like to detox. States he drinks a fifth of liquor daily.

## 2022-06-30 NOTE — ED Provider Notes (Signed)
[facility] EMERGENCY DEPT Provider Note   CSN: [id number] Arrival date & time: 06/30/22  1011     History  Chief Complaint  Patient presents with   Alcohol Problem   HPI Sean Booth is a 61 y.o. male with alcohol abuse, hypertension and hypercholesteremia presenting for alcohol problem.  Patient states that he has had a long history of alcohol abuse, starting in his teenage years. He is able to stop for a time but starts back eventually.  He recently he has been waking up multiple times at night and into the early morning hours and having a drink.  States that he is "ready to quit but afraid of what might happen when the alcohol is completely out of his system". He has been to rehab a couple of times. He said he has been given medication to prevent seizures and to help with his alcohol addiction. States that he drinks "about fifth of liquor" every day. Denies abdominal pain, chest pain, shortness of breath, and headache.   Alcohol Problem       Home Medications Prior to Admission medications   Medication Sig Start Date End Date Taking? Authorizing Provider  famotidine (PEPCID) 20 MG tablet Take 1 tablet (20 mg total) by mouth 2 (two) times daily. 09/27/20 02/22/21  Terald Sleeper, MD  lisinopril (ZESTRIL) 40 MG tablet Take 1 tablet (40 mg total) by mouth daily. 05/21/22   [name], MD  metoprolol succinate (TOPROL-XL) 50 MG 24 hr tablet TAKE 1 TABLET BY MOUTH DAILY WITH OR IMMEDIATELY FOLLOWING A MEAL 05/21/22   [name], MD  rosuvastatin (CRESTOR) 20 MG tablet TAKE 1 TABLET BY MOUTH DAILY 05/15/22   [name], MD      Allergies    Patient has no known allergies.    Review of Systems   Review of Systems  Physical Exam Updated Vital Signs BP (!) 174/149   Pulse (!) 158   Temp 97.7 F (36.5 C) (Oral)   Resp 15   Ht 5\' 9"  (1.753 m)   Wt 111.1 kg   SpO2 100%   BMI 36.18 kg/m  Physical Exam Vitals and nursing note  reviewed.  Constitutional:      Appearance: He is obese.  HENT:     Head: Normocephalic and atraumatic.     Comments: No visible tongue fasciculations on exam.    Mouth/Throat:     Mouth: Mucous membranes are moist.  Eyes:     General:        Right eye: No discharge.        Left eye: No discharge.     Conjunctiva/sclera: Conjunctivae normal.  Cardiovascular:     Rate and Rhythm: Regular rhythm. Tachycardia present.     Pulses: Normal pulses.     Heart sounds: Normal heart sounds.  Pulmonary:     Effort: Pulmonary effort is normal.     Breath sounds: Normal breath sounds.  Abdominal:     General: Abdomen is flat.     Palpations: Abdomen is soft.  Musculoskeletal:     Comments: Hands mildly tremulous bilaterally.    Skin:    General: Skin is warm and dry.  Neurological:     General: No focal deficit present.     Comments: GCS 15. Speech is goal oriented. No deficits appreciated to CN III-XII; symmetric eyebrow raise, no facial drooping, tongue midline. Patient has equal grip strength bilaterally with 5/5 strength against resistance in all major muscle groups  bilaterally. Sensation to light touch intact. Patient moves extremities without ataxia. Normal finger-nose-finger. Patient ambulatory with steady gait.   Psychiatric:        Mood and Affect: Mood normal.     ED Results / Procedures / Treatments   Labs (all labs ordered are listed, but only abnormal results are displayed) Labs Reviewed  COMPREHENSIVE METABOLIC PANEL - Abnormal; Notable for the following components:      Result Value   Sodium 129 (*)    Chloride 93 (*)    Glucose, Bld 112 (*)    Calcium 8.6 (*)    AST 57 (*)    Total Bilirubin 2.2 (*)    All other components within normal limits  ETHANOL - Abnormal; Notable for the following components:   Alcohol, Ethyl (B) 131 (*)    All other components within normal limits  URINALYSIS, ROUTINE W REFLEX MICROSCOPIC - Abnormal; Notable for the following  components:   Color, Urine COLORLESS (*)    Specific Gravity, Urine <1.005 (*)    All other components within normal limits  CBC WITH DIFFERENTIAL/PLATELET  RAPID URINE DRUG SCREEN, HOSP PERFORMED    EKG None  Radiology No results found.  Procedures Procedures    Medications Ordered in ED Medications  LORazepam (ATIVAN) tablet 1-4 mg ( Oral See Alternative 06/30/22 1714)    Or  LORazepam (ATIVAN) injection 1-4 mg (2 mg Intravenous Given 06/30/22 1714)  thiamine (VITAMIN B1) tablet 100 mg (100 mg Oral Given 06/30/22 1512)    Or  thiamine (VITAMIN B1) injection 100 mg ( Intravenous See Alternative 06/30/22 1512)  folic acid (FOLVITE) tablet 1 mg (1 mg Oral Given 06/30/22 1512)  multivitamin with minerals tablet 1 tablet (1 tablet Oral Given 06/30/22 1512)  lisinopril (ZESTRIL) tablet 40 mg (has no administration in time range)  metoprolol succinate (TOPROL-XL) 24 hr tablet 50 mg (has no administration in time range)  rosuvastatin (CRESTOR) tablet 20 mg (has no administration in time range)  sodium chloride 0.9 % bolus 1,000 mL (1,000 mLs Intravenous New Bag/Given 06/30/22 1758)    ED Course/ Medical Decision Making/ A&P                           Medical Decision Making Amount and/or Complexity of Data Reviewed Labs: ordered.  Risk OTC drugs. Prescription drug management.   Patient presented for desire to quit alcohol.  Patient stated his last drink was 5 AM this morning.  On exam he was mildly tremulous but otherwise neuro appropriate.  Placed in CIWA protocol.  Was given normal saline bolus for fluid resuscitation, Ativan for symptoms related to withdrawal, folic acid multivitamin and thiamine for alcohol withdrawal.  On reevaluation, patient continued to be mildly tremulous but otherwise appropriate from a neuro standpoint given Ativan x1.  Patient also noted to be tachycardic on reevaluation.  Patient has history of SVT and elevated BP.  Restarted his home dose of  metoprolol lisinopril and rosuvastatin.  Signed out patient to ED attending Dr. Edwin Dada.  Will be to reevaluate and reassess heart rate after receiving his home metoprolol.  Advised patient to seek outpatient therapy for his own going alcohol addiction and desire to quit drinking.  Gust return precautions associated with alcohol withdrawal.        Final Clinical Impression(s) / ED Diagnoses Final diagnoses:  Alcohol withdrawal syndrome with complication (HCC)    Rx / DC Orders ED Discharge Orders  None         Harriet Pho, PA-C 06/30/22 1908    Tegeler, Gwenyth Allegra, MD 07/01/22 [phone]

## 2022-06-30 NOTE — ED Notes (Signed)
Reviewed AVS/discharge instruction with patient. Time allotted for and all questions answered. Patient is agreeable for d/c and escorted to ed exit by staff.  

## 2022-06-30 NOTE — ED Provider Notes (Signed)
Patient states he has a history of SVT and elevated heart rate readings.  States he not take his metoprolol prior to coming to the emergency department today.  Heart rates been running around 1 10-1 15.  I do not believe this to be secondary to acute withdrawal at this time.  Patient has a low CIWA score and is relaxed and nontremulous at this time.  Metoprolol given.  Heart rate 97 bpm.  Patient has been through rehab multiple times and has trialed Librium.  Agrees to attempt to stop drinking at this time.  Librium sent to pharmacy.  No signs of DTs or severe alcohol withdrawal symptoms.  She is alert and oriented x3, no acute distress, afebrile, with stable vital signs.  GCS of 15.  No alcoholic ketoacidosis.  Recommended for close follow-up with primary care physician for further management of alcohol abuse and alcohol withdrawal symptoms.  Patient in no distress and overall condition improved here in the ED. Detailed discussions were had with the patient regarding current findings, and need for close f/u with PCP or on call doctor. The patient has been instructed to return immediately if the symptoms worsen in any way for re-evaluation. Patient verbalized understanding and is in agreement with current care plan. All questions answered prior to discharge.    Lianne Cure, DO 65/46/50 2105

## 2022-06-30 NOTE — Progress Notes (Signed)
Transition of Care [facility]) - Emergency Department Mini Assessment   Patient Details  Name: Sean Booth MRN: [id number] Date of Birth: Apr 02, 1961  Transition of Care [facility]) CM/SW Contact:    Rodney Booze, LCSW Phone Number: 06/30/2022, 3:25 PM   Clinical Narrative:  CSW spoke to Pt on the Phone, The Pt reports wanting to stop drinking. CSW spoke to him and tried to give the Pt encouragement as he goes on this journey to Detox. CSW added many resources to the Pt AVS.   ED Mini Assessment: What brought you to the Emergency Department? : (P) Detox  Barriers to Discharge: (P) No Barriers Identified  Barrier interventions: (P) Pt would like to stop drinking states he has failed many times  Means of departure: (P) Not know  Interventions which prevented an admission or readmission: (P) SUD counseling    Patient Contact and Communications       Contact Date: (P) 06/30/22,          Patient states their goals for this hospitalization and ongoing recovery are:: (P) Pt would like to stop drinking.      Admission diagnosis:  possible alcohol poisioning Patient Active Problem List   Diagnosis Date Noted   Obesity 03/14/2014   H/O ETOH abuse 02/18/2012   Essential hypertension, benign 02/18/2012   Alcohol consumption heavy 02/18/2012   Hypercholesteremia    SVT (supraventricular tachycardia)    PCP:  Hulan Fess, MD Pharmacy:   [facility] [id number] - [place], [place] [address] Phone: [phone] Fax: [phone]

## 2022-06-30 NOTE — Progress Notes (Unsigned)
Cardiology Office Note   Date:  07/01/2022   ID:  Sean Booth, DOB November 11, 1960, MRN [id number]  PCP:  Catha Gosselin, MD    No chief complaint on file.  SVT  Wt Readings from Last 3 Encounters:  07/01/22 267 lb (121.1 kg)  06/30/22 245 lb (111.1 kg)  02/22/21 260 lb (117.9 kg)       History of Present Illness: Sean Booth is a 61 y.o. male  who has had issues with alcohol and had tachycardia in the past. He had an SVT ablation in 2008. He had some tachycardia after. He was given a prescription for a beta blocker.    His PMD thought it was panic attacks/anxiety. He was on Zoloft and Xanax in the past.   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  He works for the [employer]). He has had less stress at work with the current job. He had stopped drinking and was going to [employer] He restarted moderate alcohol. He then cut back to one beer every few weeks.      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  This slowed down his exercise.       Wife had CHF and was in the hospital, and will need an AICD. 44 year old  Daughter had DRES syndrome- allergic response to medicine.   He had difficulty losing weight and keeping it off.      Had an issue with alcohol withdrawal sx in Jan 2022.  He had COVID in 2022 Jan, along with his wife.   Had an alcohol relapse and went to the ER yesterday to help detox. Went to [facility].   He received some IV fluids.  HR was high at the time so he was kept there for hours.  Not SVT, just sinus tach- he feels from EtOH withdrawal. Librium prescribed.   Denies : Chest pain. Dizziness. Leg edema. Nitroglycerin use. Orthopnea. Palpitations. Paroxysmal nocturnal dyspnea. Shortness of breath. Syncope.    Past Medical History:  Diagnosis Date   Alcohol abuse    Anxiety    GERD (gastroesophageal reflux disease)    Hypercholesteremia    Hypertension    SVT (supraventricular tachycardia)      Past Surgical History:  Procedure Laterality Date   COLONOSCOPY     RADIOFREQUENCY ABLATION     for SVT (01/2011)     Current Outpatient Medications  Medication Sig Dispense Refill   chlordiazePOXIDE (LIBRIUM) 25 MG capsule 50mg  PO TID x 1D, then 25-50mg  PO BID X 1D, then 25-50mg  PO QD X 1D 10 capsule 0   famotidine (PEPCID) 20 MG tablet Take 1 tablet (20 mg total) by mouth 2 (two) times daily. 30 tablet 1   lisinopril (ZESTRIL) 40 MG tablet Take 1 tablet (40 mg total) by mouth daily. 30 tablet 1   metoprolol succinate (TOPROL-XL) 50 MG 24 hr tablet TAKE 1 TABLET BY MOUTH DAILY WITH OR IMMEDIATELY FOLLOWING A MEAL 30 tablet 1   rosuvastatin (CRESTOR) 20 MG tablet TAKE 1 TABLET BY MOUTH DAILY 30 tablet 0   No current facility-administered medications for this visit.    Allergies:   Patient has no known allergies.    Social History:  The patient  reports that he has never smoked. He has never used smokeless tobacco. He reports current alcohol use. He reports that he does not use drugs.   Family History:  The patient's  family history includes Anxiety disorder in his mother; Heart attack in his father and maternal grandfather; Hypertension in his mother.    ROS:  Please see the history of present illness.   Otherwise, review of systems are positive for alcohol use.   All other systems are reviewed and negative.    PHYSICAL EXAM: VS:  BP 126/84   Pulse (!) 104   Ht 5\' 9"  (1.753 m)   Wt 267 lb (121.1 kg)   SpO2 96%   BMI 39.43 kg/m  , BMI Body mass index is 39.43 kg/m. GEN: Well nourished, well developed, in no acute distress HEENT: red sclera Neck: no JVD, carotid bruits, or masses Cardiac: RRR; no murmurs, rubs, or gallops,no edema  Respiratory:  clear to auscultation bilaterally, normal work of breathing GI: soft, nontender, nondistended, + BS, obese MS: no deformity or atrophy Skin: warm and dry, no rash Neuro:  Strength and sensation are intact Psych: euthymic  mood, full affect   EKG:   The ekg ordered today demonstrates sinus tach, no ST changes   Recent Labs: 06/30/2022: ALT 39; BUN 18; Creatinine, Ser 1.15; Hemoglobin 15.8; Platelets 273; Potassium 3.8; Sodium 129   Lipid Panel    Component Value Date/Time   CHOL 125 08/16/2021 0839   TRIG 90 08/16/2021 0839   HDL 43 08/16/2021 0839   CHOLHDL 2.9 08/16/2021 0839   CHOLHDL 5.0 (H) 07/29/2016 0924   VLDL 18 07/29/2016 0924   LDLCALC 65 08/16/2021 0839     Other studies Reviewed: Additional studies/ records that were reviewed today with results demonstrating: labs reviewed.   ASSESSMENT AND PLAN:  SVT: Sx controlled.  Refilled metoprolol today.  Sinus tach related to EtOH withdrawal.  I congratulated him on tying to stop drinking.  Hypertension: Refill lisinopril.  Recheck 126/84.   Hyperlipidemia: LDL 65 in December 2022 HDL 43 triglycerides 90 total cholesterol 125.  Continue rosuvastatin 20 mg daily. Obesity: Healthy diet.  Cut down on carbs from alcohol.  History of alcohol abuse: Now on Librium for short period of time.   Current medicines are reviewed at length with the patient today.  The patient concerns regarding his medicines were addressed.  The following changes have been made:  No change  Labs/ tests ordered today include:  No orders of the defined types were placed in this encounter.   Recommend 150 minutes/week of aerobic exercise Low fat, low carb, high fiber diet recommended  Disposition:   FU in 1 year   Signed, Larae Grooms, MD  07/01/2022 11:13 AM    [facility] [place], [place], [place]  [address] Phone: [phone]; Fax: [phone]

## 2022-06-30 NOTE — Discharge Instructions (Addendum)
CSW added substance resources.   [facility]: Address: [place], [place] [address] Hours:  Open ? Closes 10?PM Phone: [phone]  Evaluation for your ongoing alcohol abuse and associated alcohol withdrawal was overall reassuring.  At this time, you are not in severe alcohol withdrawal.  Would recommend that you follow-up with a rehab facility that can help facilitate your strong desire to quit drinking.  If you have worsening tremors, nausea and vomiting, hallucinations, severe anxiety, and seizure please return to the emergency department for further evaluation as her common symptoms associated with alcohol intoxication.

## 2022-07-01 ENCOUNTER — Encounter: Payer: Self-pay | Admitting: Interventional Cardiology

## 2022-07-01 ENCOUNTER — Ambulatory Visit: Payer: BC Managed Care – PPO | Attending: Interventional Cardiology | Admitting: Interventional Cardiology

## 2022-07-01 VITALS — BP 126/84 | HR 104 | Ht 69.0 in | Wt 267.0 lb

## 2022-07-01 DIAGNOSIS — I471 Supraventricular tachycardia, unspecified: Secondary | ICD-10-CM | POA: Diagnosis not present

## 2022-07-01 DIAGNOSIS — E7800 Pure hypercholesterolemia, unspecified: Secondary | ICD-10-CM

## 2022-07-01 DIAGNOSIS — F1011 Alcohol abuse, in remission: Secondary | ICD-10-CM | POA: Diagnosis not present

## 2022-07-01 DIAGNOSIS — R7301 Impaired fasting glucose: Secondary | ICD-10-CM

## 2022-07-01 DIAGNOSIS — I10 Essential (primary) hypertension: Secondary | ICD-10-CM | POA: Diagnosis not present

## 2022-07-01 MED ORDER — METOPROLOL SUCCINATE ER 50 MG PO TB24: ORAL_TABLET | ORAL | 3 refills | Status: DC

## 2022-07-01 MED ORDER — ROSUVASTATIN CALCIUM 20 MG PO TABS: 20.0000 mg | ORAL_TABLET | Freq: Every day | ORAL | 3 refills | Status: DC

## 2022-07-01 MED ORDER — LISINOPRIL 40 MG PO TABS: 40.0000 mg | ORAL_TABLET | Freq: Every day | ORAL | 3 refills | Status: DC

## 2022-07-01 NOTE — Patient Instructions (Signed)
Medication Instructions:  Your physician recommends that you continue on your current medications as directed. Please refer to the Current Medication list given to you today.  *If you need a refill on your cardiac medications before your next appointment, please call your pharmacy*   Lab Work: none If you have labs (blood work) drawn today and your tests are completely normal, you will receive your results only by: MyChart Message (if you have MyChart) OR A paper copy in the mail If you have any lab test that is abnormal or we need to change your treatment, we will call you to review the results.   Testing/Procedures: none   Follow-Up: At [facility], you and your health needs are our priority.  As part of our continuing mission to provide you with exceptional heart care, we have created designated Provider Care Teams.  These Care Teams include your primary Cardiologist (physician) and Advanced Practice Providers (APPs -  Physician Assistants and Nurse Practitioners) who all work together to provide you with the care you need, when you need it.  We recommend signing up for the patient portal called "MyChart".  Sign up information is provided on this After Visit Summary.  MyChart is used to connect with patients for Virtual Visits (Telemedicine).  Patients are able to view lab/test results, encounter notes, upcoming appointments, etc.  Non-urgent messages can be sent to your provider as well.   To learn more about what you can do with MyChart, go to [email or web address].    Your next appointment:   12 month(s)  The format for your next appointment:   In Person  Provider:   Jayadeep Varanasi, MD     Other Instructions    Important Information About Sugar       

## 2022-10-31 ENCOUNTER — Telehealth: Payer: Self-pay | Admitting: Interventional Cardiology

## 2022-10-31 MED ORDER — CARVEDILOL 12.5 MG PO TABS: 12.5000 mg | ORAL_TABLET | Freq: Two times a day (BID) | ORAL | 3 refills | Status: DC

## 2022-10-31 NOTE — Telephone Encounter (Signed)
I spoke with patient.  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  Was running 145-150/95-100. Heart rate was 95-110.  Prior to physical yesterday patient was not checking his BP at home.  He started checking BP yesterday and he continues to get readings similar to those at physical visit. Patient reports being under increased stress recently.  Feels he has gained weight.  Not sleeping well.  No dizziness. No headaches.  Watches salt intake.  Taking Lisinopril and Toprol as listed. Will forward to Dr Irish Lack for review/recommendations.

## 2022-10-31 NOTE — Telephone Encounter (Signed)
Stop Toprol-XL. start carvedilol 12.5 mg twice a day.  Continue to monitor blood pressure at home.  If blood pressure stays elevated, may need to consider amlodipine.

## 2022-10-31 NOTE — Telephone Encounter (Signed)
Pt c/o BP issue: STAT if pt c/o blurred vision, one-sided weakness or slurred speech  1. What are your last 5 BP readings?   Around 145/95 - 150/100  2. Are you having any other symptoms (ex. Dizziness, headache, blurred vision, passed out)?   No  3. What is your BP issue?    Patient is concern BP has been trending high.  Patient stated he would like to discuss adjusting his medications.

## 2022-10-31 NOTE — Telephone Encounter (Signed)
Patient notified.  Prescription sent to [facility] on [address].  Patient will let us know if BP remains elevated.

## 2022-12-29 ENCOUNTER — Other Ambulatory Visit: Payer: Self-pay

## 2022-12-29 MED ORDER — LISINOPRIL 40 MG PO TABS: 40.0000 mg | ORAL_TABLET | Freq: Every day | ORAL | 1 refills | Status: DC

## 2022-12-29 NOTE — Telephone Encounter (Signed)
Pt's medication was sent to pt's pharmacy as requested. Confirmation received.  °

## 2023-07-19 ENCOUNTER — Other Ambulatory Visit: Payer: Self-pay | Admitting: Interventional Cardiology

## 2023-08-20 ENCOUNTER — Telehealth: Payer: Self-pay | Admitting: Interventional Cardiology

## 2023-08-20 NOTE — Telephone Encounter (Signed)
*  STAT* If patient is at the pharmacy, call can be transferred to refill team.   1. Which medications need to be refilled? (please list name of each medication and dose if known) lisinopril (ZESTRIL) 40 MG tablet   2. Which pharmacy/location (including street and city if local pharmacy) is medication to be sent to?  [facility] [id number] - [place], [place] - [address]    3. Do they need a 30 day or 90 day supply? 90

## 2023-08-21 ENCOUNTER — Other Ambulatory Visit: Payer: Self-pay

## 2023-08-21 MED ORDER — LISINOPRIL 40 MG PO TABS: 40.0000 mg | ORAL_TABLET | Freq: Every day | ORAL | 0 refills | Status: DC

## 2023-08-21 NOTE — Telephone Encounter (Signed)
 RX sent to requested Pharmacy

## 2023-09-16 ENCOUNTER — Other Ambulatory Visit: Payer: Self-pay | Admitting: Interventional Cardiology

## 2023-09-25 ENCOUNTER — Other Ambulatory Visit: Payer: Self-pay

## 2023-09-25 ENCOUNTER — Telehealth: Payer: Self-pay | Admitting: Cardiology

## 2023-09-25 MED ORDER — LISINOPRIL 40 MG PO TABS: 40.0000 mg | ORAL_TABLET | Freq: Every day | ORAL | 0 refills | Status: DC

## 2023-09-25 NOTE — Telephone Encounter (Signed)
*  STAT* If patient is at the pharmacy, call can be transferred to refill team.   1. Which medications need to be refilled? (please list name of each medication and dose if known) [facility] [id number] - [place], [place] - [address]  2. Which pharmacy/location (including street and city if local pharmacy) is medication to be sent to? [facility] [id number] - [place], [place] - [address]  3 Do they need a 30 day or 90 day supply?  90 day supply

## 2023-09-25 NOTE — Telephone Encounter (Signed)
 Follow up:     The medicine is Lisinopril, not Losartan, The medicine is not Losartan, error was made in sennding this last message. Lisnopril is the correct medicine.

## 2023-09-25 NOTE — Telephone Encounter (Signed)
No meds listed.

## 2023-09-25 NOTE — Telephone Encounter (Signed)
 Follow Up:    Patient says he still have not received his Losartan.  He says he has an appointment for 10-14-23.

## 2023-10-06 ENCOUNTER — Other Ambulatory Visit: Payer: Self-pay

## 2023-10-06 MED ORDER — ROSUVASTATIN CALCIUM 20 MG PO TABS: 20.0000 mg | ORAL_TABLET | Freq: Every day | ORAL | 0 refills | Status: DC

## 2023-10-14 ENCOUNTER — Ambulatory Visit: Payer: 59 | Attending: Cardiology | Admitting: Cardiology

## 2023-10-14 ENCOUNTER — Encounter: Payer: Self-pay | Admitting: Cardiology

## 2023-10-14 VITALS — BP 138/96 | HR 62 | Ht 69.0 in | Wt 246.2 lb

## 2023-10-14 DIAGNOSIS — I471 Supraventricular tachycardia, unspecified: Secondary | ICD-10-CM | POA: Diagnosis not present

## 2023-10-14 DIAGNOSIS — F1011 Alcohol abuse, in remission: Secondary | ICD-10-CM

## 2023-10-14 DIAGNOSIS — I10 Essential (primary) hypertension: Secondary | ICD-10-CM | POA: Diagnosis not present

## 2023-10-14 DIAGNOSIS — E7800 Pure hypercholesterolemia, unspecified: Secondary | ICD-10-CM | POA: Diagnosis not present

## 2023-10-14 MED ORDER — CARVEDILOL 12.5 MG PO TABS: 12.5000 mg | ORAL_TABLET | Freq: Two times a day (BID) | ORAL | 3 refills | Status: AC

## 2023-10-14 MED ORDER — LISINOPRIL 40 MG PO TABS: 40.0000 mg | ORAL_TABLET | Freq: Every day | ORAL | 3 refills | Status: AC

## 2023-10-14 NOTE — Progress Notes (Signed)
Cardiology Office Note   Date:  10/14/2023   ID:  Sean Booth, DOB 1961/04/25, MRN [id number]  PCP:  Sean Gosselin, MD    Chief Complaint  Patient presents with   Hypertension   Hyperlipidemia   Tachycardia    Wt Readings from Last 3 Encounters:  10/14/23 246 lb 3.2 oz (111.7 kg)  07/01/22 267 lb (121.1 kg)  06/30/22 245 lb (111.1 kg)       History of Present Illness: Sean Booth is a 63 y.o. male  who has had issues with alcohol and had tachycardia in the past. He had an SVT ablation in 2008. He had some tachycardia after. He was given a prescription for a beta blocker.    His PMD thought it was panic attacks/anxiety. He was on Zoloft and Xanax in the past.   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  He works for the [employer]). He has had less stress at work with the current job. He had stopped drinking and was going to [employer] He restarted moderate alcohol. He then cut back to one beer every few weeks.      He had difficulty losing weight and keeping it off.      Had an issue with alcohol withdrawal sx in Jan 2022.  He had COVID in 2022 Jan, along with his wife.   Had an alcohol relapse and went to the ER yesterday to help detox. Went to [facility].   He received some IV fluids.  HR was high at the time so he was kept there for hours.  Not SVT, just sinus tach- he feels from EtOH withdrawal. Librium prescribed.   He is here today for followup and is doing well.  He denies any chest pain or pressure, SOB, DOE, PND, orthopnea, LE edema, dizziness, palpitations or syncope. He says that he only drinks 1 alcoholic drink daily or none at all. He says if he tries to drink 3-4 alcoholic drinks his heart will race.  He is compliant with his meds and is tolerating meds with no SE.    Past Medical History:  Diagnosis Date   Alcohol abuse    Anxiety    GERD (gastroesophageal reflux disease)    Hypercholesteremia    Hypertension     SVT (supraventricular tachycardia) (HCC)     Past Surgical History:  Procedure Laterality Date   COLONOSCOPY     RADIOFREQUENCY ABLATION     for SVT (01/2011)     Current Outpatient Medications  Medication Sig Dispense Refill   carvedilol (COREG) 12.5 MG tablet Take 1 tablet (12.5 mg total) by mouth 2 (two) times daily. 180 tablet 3   lisinopril (ZESTRIL) 40 MG tablet Take 1 tablet (40 mg total) by mouth daily. 30 tablet 0   rosuvastatin (CRESTOR) 20 MG tablet Take 1 tablet (20 mg total) by mouth daily. 30 tablet 0   chlordiazePOXIDE (LIBRIUM) 25 MG capsule 50mg  PO TID x 1D, then 25-50mg  PO BID X 1D, then 25-50mg  PO QD X 1D 10 capsule 0   famotidine (PEPCID) 20 MG tablet Take 1 tablet (20 mg total) by mouth 2 (two) times daily. 30 tablet 1   No current facility-administered medications for this visit.    Allergies:   Patient has no known allergies.    Social History:  The patient  reports that he has never smoked. He has never used smokeless tobacco. He reports current alcohol use. He  reports that he does not use drugs.   Family History:  The patient's family history includes Anxiety disorder in his mother; Heart attack in his father and maternal grandfather; Hypertension in his mother.    ROS:  Please see the history of present illness.   Otherwise, review of systems are positive for alcohol use.   All other systems are reviewed and negative.    PHYSICAL EXAM: VS:  BP (!) 138/96 (BP Location: Left Arm, Patient Position: Sitting, Cuff Size: Large)   Pulse 62   Ht 5\' 9"  (1.753 m)   Wt 246 lb 3.2 oz (111.7 kg)   SpO2 96%   BMI 36.36 kg/m  , BMI Body mass index is 36.36 kg/m. GEN: Well nourished, well developed in no acute distress HEENT: Normal NECK: No JVD; No carotid bruits LYMPHATICS: No lymphadenopathy CARDIAC:RRR, no murmurs, rubs, gallops RESPIRATORY:  Clear to auscultation without rales, wheezing or rhonchi  ABDOMEN: Soft, non-tender,  non-distended MUSCULOSKELETAL:  No edema; No deformity  SKIN: Warm and dry NEUROLOGIC:  Alert and oriented x 3 PSYCHIATRIC:  Normal affect   Recent Labs:  EKG Interpretation Date/Time:  Wednesday October 14 2023 11:16:14 EST Ventricular Rate:  62 PR Interval:  180 QRS Duration:  72 QT Interval:  400 QTC Calculation: 406 R Axis:   4  Text Interpretation: Normal sinus rhythm with sinus arrhythmia Low voltage QRS Nonspecific ST abnormality When compared with ECG of 27-Sep-2020 12:02, PREVIOUS ECG IS PRESENT Confirmed by Armanda Magic ([id number]) on 10/14/2023 11:32:07 AM    No results found for requested labs within last 365 days.   Lipid Panel    Component Value Date/Time   CHOL 125 08/16/2021 0839   TRIG 90 08/16/2021 0839   HDL 43 08/16/2021 0839   CHOLHDL 2.9 08/16/2021 0839   CHOLHDL 5.0 (H) 07/29/2016 0924   VLDL 18 07/29/2016 0924   LDLCALC 65 08/16/2021 0839     Other studies Reviewed: Additional studies/ records that were reviewed today with results demonstrating: labs reviewed.   ASSESSMENT AND PLAN:  #SVT:  -s/p remote ablation -He has had problems with sinus tach related to EtOH withdrawal.   -Denies any significant palpitations since being seen by Dr. Eldridge Dace and has really cut out the ETOH -Continue prescription drug management carvedilol 12.5 mg twice daily and lisinopril 40 mg daily with as needed refills  #Hypertension:  -BP is elevated today but he ran out of his Lisinopril a month ago.  He also says that his BP is usually elevated when he first comes in to the MD office but when rechecked it is fine -Continue prescription drug management with lisinopril 40 mg daily and carvedilol 12.5 mg twice daily with as needed refills>>refilled his Lisinopril and Carvedilol for 1 year today -check BMET -I have asked him to check his BP daily for a week and call with results  #Hyperlipidemia:  -LDL goal less than 100  -Check FLP and ALT  -Continue prescription  drug management with Crestor 20 mg daily with as needed refills -check coronary Ca score to assess for future cardiac risk  #History of alcohol abuse: -he has really cut back on ETOH to 1 drink daily or none at all  Current medicines are reviewed at length with the patient today.  The patient concerns regarding his medicines were addressed.  The following changes have been made:  No change  Labs/ tests ordered today include:   Orders Placed This Encounter  Procedures   EKG 12-Lead  Recommend 150 minutes/week of aerobic exercise Low fat, low carb, high fiber diet recommended  Disposition:   FU in 1 year   Signed, Armanda Magic, MD  10/14/2023 11:40 AM    [facility] [address] [place], [place], [place]  [address] Phone: [phone]; Fax: [phone]

## 2023-10-14 NOTE — Addendum Note (Signed)
Addended by: Bertram Millard on: 10/14/2023 11:54 AM   Modules accepted: Orders

## 2023-10-14 NOTE — Patient Instructions (Addendum)
Medication Instructions:   *If you need a refill on your cardiac medications before your next appointment, please call your pharmacy*   Lab Work: Lipid and cmet today  If you have labs (blood work) drawn today and your tests are completely normal, you will receive your results only by: MyChart Message (if you have MyChart) OR A paper copy in the mail If you have any lab test that is abnormal or we need to change your treatment, we will call you to review the results.   Testing/Procedures:  CALCIUM SCORE     Follow-Up: At [facility], you and your health needs are our priority.  As part of our continuing mission to provide you with exceptional heart care, we have created designated Provider Care Teams.  These Care Teams include your primary Cardiologist (physician) and Advanced Practice Providers (APPs -  Physician Assistants and Nurse Practitioners) who all work together to provide you with the care you need, when you need it.  We recommend signing up for the patient portal called "MyChart".  Sign up information is provided on this After Visit Summary.  MyChart is used to connect with patients for Virtual Visits (Telemedicine).  Patients are able to view lab/test results, encounter notes, upcoming appointments, etc.  Non-urgent messages can be sent to your provider as well.   To learn more about what you can do with MyChart, go to [email or web address].    Your next appointment:  ONE YEAR WITH DR TURNER   Coronary Calcium Scan A coronary calcium scan is an imaging test used to look for deposits of plaque in the inner lining of the blood vessels of the heart (coronary arteries). Plaque is made up of calcium, protein, and fatty substances. These deposits of plaque can partly clog and narrow the coronary arteries without producing any symptoms or warning signs. This puts a person at risk for a heart attack. A coronary calcium scan is performed using a computed tomography (CT)  scanner machine without using a dye (contrast). This test is recommended for people who are at moderate risk for heart disease. The test can find plaque deposits before symptoms develop. Tell a health care provider about: Any allergies you have. All medicines you are taking, including vitamins, herbs, eye drops, creams, and over-the-counter medicines. Any problems you or family members have had with anesthetic medicines. Any bleeding problems you have. Any surgeries you have had. Any medical conditions you have. Whether you are pregnant or may be pregnant. What are the risks? Generally, this is a safe procedure. However, problems may occur, including: Harm to a pregnant woman and her unborn baby. This test involves the use of radiation. Radiation exposure can be dangerous to a pregnant woman and her unborn baby. If you are pregnant or think you may be pregnant, you should not have this procedure done. A slight increase in the risk of cancer. This is because of the radiation involved in the test. The amount of radiation from one test is similar to the amount of radiation you are naturally exposed to over one year. What happens before the procedure? Ask your health care provider for any specific instructions on how to prepare for this procedure. You may be asked to avoid products that contain caffeine, tobacco, or nicotine for 4 hours before the procedure. What happens during the procedure?  You will undress and remove any jewelry from your neck or chest. You may need to remove hearing aides and dentures. Women may need to remove their  bras. You will put on a hospital gown. Sticky electrodes will be placed on your chest. The electrodes will be connected to an electrocardiogram (ECG) machine to record a tracing of the electrical activity of your heart. You will lie down on your back on a curved bed that is attached to the CT scanner. You may be given medicine to slow down your heart rate so that  clear pictures can be created. You will be moved into the CT scanner, and the CT scanner will take pictures of your heart. During this time, you will be asked to lie still and hold your breath for 10-20 seconds at a time while each picture of your heart is being taken. The procedure may vary among health care providers and hospitals. What can I expect after the procedure? You can return to your normal activities. It is up to you to get the results of your procedure. Ask your health care provider, or the department that is doing the procedure, when your results will be ready. Summary A coronary calcium scan is an imaging test used to look for deposits of plaque in the inner lining of the blood vessels of the heart. Plaque is made up of calcium, protein, and fatty substances. A coronary calcium scan is performed using a CT scanner machine without contrast. Generally, this is a safe procedure. Tell your health care provider if you are pregnant or may be pregnant. Ask your health care provider for any specific instructions on how to prepare for this procedure. You can return to your normal activities after the scan is done. This information is not intended to replace advice given to you by your health care provider. Make sure you discuss any questions you have with your health care provider. Document Revised: 08/11/2021 Document Reviewed: 08/11/2021 Elsevier Patient Education  2024 [employer].

## 2023-10-17 DIAGNOSIS — R931 Abnormal findings on diagnostic imaging of heart and coronary circulation: Secondary | ICD-10-CM | POA: Insufficient documentation

## 2023-10-17 HISTORY — DX: Abnormal findings on diagnostic imaging of heart and coronary circulation: R93.1

## 2023-10-23 ENCOUNTER — Ambulatory Visit (HOSPITAL_COMMUNITY)
Admission: RE | Admit: 2023-10-23 | Discharge: 2023-10-23 | Disposition: A | Discharge status: Home / Self Care | Payer: Self-pay | Source: Ambulatory Visit | Attending: Cardiology | Admitting: Cardiology

## 2023-10-23 ENCOUNTER — Encounter (HOSPITAL_COMMUNITY): Payer: Self-pay

## 2023-10-23 DIAGNOSIS — E7800 Pure hypercholesterolemia, unspecified: Secondary | ICD-10-CM | POA: Insufficient documentation

## 2023-10-23 DIAGNOSIS — I471 Supraventricular tachycardia, unspecified: Secondary | ICD-10-CM | POA: Insufficient documentation

## 2023-10-23 DIAGNOSIS — F1011 Alcohol abuse, in remission: Secondary | ICD-10-CM | POA: Insufficient documentation

## 2023-10-23 DIAGNOSIS — I10 Essential (primary) hypertension: Secondary | ICD-10-CM | POA: Insufficient documentation

## 2023-10-25 ENCOUNTER — Encounter: Payer: Self-pay | Admitting: Cardiology

## 2023-11-02 ENCOUNTER — Telehealth: Payer: Self-pay

## 2023-11-02 DIAGNOSIS — R931 Abnormal findings on diagnostic imaging of heart and coronary circulation: Secondary | ICD-10-CM

## 2023-11-02 DIAGNOSIS — E7800 Pure hypercholesterolemia, unspecified: Secondary | ICD-10-CM

## 2023-11-02 DIAGNOSIS — Z79899 Other long term (current) drug therapy: Secondary | ICD-10-CM

## 2023-11-02 NOTE — Telephone Encounter (Signed)
 Call to patient to advise of elevated coronary calcium score. No answer, left detailed message per DPR that coronary Ca score elevated at 75 which is 58th% for age, sex and race matched controls. Explained aortic atherosclerosis also present and Dr. Mayford Knife would like him to come in for FLP and ALT. Advised that orders would be waiting for him at [facility] and to please go in as soon as possible to complete these fasting labs. Asked patient to call our office if any questions.

## 2023-11-02 NOTE — Telephone Encounter (Signed)
-----   Message from Armanda Magic sent at 10/25/2023  7:49 PM EST ----- Coronary Ca score elevated at 75 which is 58th% for age, sex and race matched controls and aortic atherosclerosis.  Please have him come in for FLP and ALT.

## 2023-11-05 NOTE — Telephone Encounter (Signed)
 Patient is returning call and is requesting call back.

## 2023-11-05 NOTE — Telephone Encounter (Signed)
 LVMTCB 2/20

## 2023-11-06 LAB — ALT: ALT: 28 [IU]/L (ref 0–44)

## 2023-11-06 LAB — LIPID PANEL
Chol/HDL Ratio: 2.8 {ratio} (ref 0.0–5.0)
Cholesterol, Total: 122 mg/dL (ref 100–199)
HDL: 44 mg/dL (ref >39)
LDL Chol Calc (NIH): 60 mg/dL (ref 0–99)
Triglycerides: 97 mg/dL (ref 0–149)
VLDL Cholesterol Cal: 18 mg/dL (ref 5–40)

## 2023-11-08 ENCOUNTER — Other Ambulatory Visit: Payer: Self-pay | Admitting: Cardiology

## 2023-11-09 ENCOUNTER — Other Ambulatory Visit: Payer: Self-pay | Admitting: Interventional Cardiology

## 2023-11-26 ENCOUNTER — Telehealth: Payer: Self-pay | Admitting: Cardiology

## 2023-11-26 NOTE — Telephone Encounter (Signed)
 Spoke to pt. Results given. All questions, if asked, addressed a this time.

## 2023-11-26 NOTE — Telephone Encounter (Signed)
 Patient calling in about the results, from his labs he had to get done. Please advise

## 2024-01-11 ENCOUNTER — Telehealth: Payer: Self-pay | Admitting: Cardiology

## 2024-01-11 NOTE — Telephone Encounter (Signed)
 Spoke with patient and he states last night he felt his HR was elevated and felt his heart was skipping beats. He states his HR was all over the place. He can not give an exact reading. He is currently at work and asymptomatic. He is under a lot of stress.  ED precautions discussed will forward to MD

## 2024-01-11 NOTE — Telephone Encounter (Signed)
 Patient c/o Palpitations:  STAT if patient reporting lightheadedness, shortness of breath, or chest pain  How long have you had palpitations/irregular HR/ Afib? Are you having the symptoms now? Thinks his heart beat is irregular  Are you currently experiencing lightheadedness, SOB or CP? no  Do you have a history of afib (atrial fibrillation) or irregular heart rhythm?   Have you checked your BP or HR? (document readings if available):   Are you experiencing any other symptoms? Patient wants to know if he can come in today for an EKG?

## 2024-01-12 NOTE — Telephone Encounter (Signed)
 Left voicemail to return call to office

## 2024-01-12 NOTE — Telephone Encounter (Signed)
Patient returned Rn's call.

## 2024-01-12 NOTE — Telephone Encounter (Signed)
 Spoke with patient and he states he does not need a monitor right now. He feels he was going through alcohol  withdrawal.   He will call back if he change his mind about the monitor

## 2024-01-12 NOTE — Telephone Encounter (Signed)
 Spoke with Sean Booth regarding alcohol  withdrawal. Sean Booth was told that Dr. Charl Concha suggestion would be to see his PCP. Sean Booth stated he made an appointment to see his PCP next week and that he believes he is done with the withdrawal and feeling better.

## 2024-08-10 ENCOUNTER — Other Ambulatory Visit: Payer: Self-pay

## 2024-08-10 ENCOUNTER — Emergency Department (HOSPITAL_BASED_OUTPATIENT_CLINIC_OR_DEPARTMENT_OTHER)
Admission: EM | Admit: 2024-08-10 | Discharge: 2024-08-10 | Disposition: A | Discharge status: Home / Self Care | Attending: Emergency Medicine | Admitting: Emergency Medicine

## 2024-08-10 ENCOUNTER — Encounter (HOSPITAL_BASED_OUTPATIENT_CLINIC_OR_DEPARTMENT_OTHER): Payer: Self-pay | Admitting: Emergency Medicine

## 2024-08-10 DIAGNOSIS — R42 Dizziness and giddiness: Secondary | ICD-10-CM | POA: Diagnosis present

## 2024-08-10 DIAGNOSIS — E119 Type 2 diabetes mellitus without complications: Secondary | ICD-10-CM | POA: Insufficient documentation

## 2024-08-10 DIAGNOSIS — Z79899 Other long term (current) drug therapy: Secondary | ICD-10-CM | POA: Diagnosis not present

## 2024-08-10 DIAGNOSIS — Y908 Blood alcohol level of 240 mg/100 ml or more: Secondary | ICD-10-CM | POA: Diagnosis not present

## 2024-08-10 DIAGNOSIS — I10 Essential (primary) hypertension: Secondary | ICD-10-CM | POA: Diagnosis not present

## 2024-08-10 DIAGNOSIS — E871 Hypo-osmolality and hyponatremia: Secondary | ICD-10-CM | POA: Diagnosis not present

## 2024-08-10 DIAGNOSIS — F1020 Alcohol dependence, uncomplicated: Secondary | ICD-10-CM | POA: Insufficient documentation

## 2024-08-10 LAB — LIPASE, BLOOD: Lipase: 31 U/L (ref 11–51)

## 2024-08-10 LAB — URINALYSIS, ROUTINE W REFLEX MICROSCOPIC
Bilirubin Urine: NEGATIVE
Glucose, UA: NEGATIVE mg/dL
Hgb urine dipstick: NEGATIVE
Ketones, ur: NEGATIVE mg/dL
Leukocytes,Ua: NEGATIVE
Nitrite: NEGATIVE
Protein, ur: NEGATIVE mg/dL
Specific Gravity, Urine: <1.005 — ABNORMAL LOW (ref 1.005–1.030)
pH: 5.5 (ref 5.0–8.0)

## 2024-08-10 LAB — ETHANOL: Alcohol, Ethyl (B): 244 mg/dL — ABNORMAL HIGH (ref <15)

## 2024-08-10 LAB — CBC WITH DIFFERENTIAL/PLATELET
Abs Immature Granulocytes: 0.03 K/uL (ref 0.00–0.07)
Basophils Absolute: 0 K/uL (ref 0.0–0.1)
Basophils Relative: 0 %
Eosinophils Absolute: 0.1 K/uL (ref 0.0–0.5)
Eosinophils Relative: 2 %
HCT: 35.6 % — ABNORMAL LOW (ref 39.0–52.0)
Hemoglobin: 12.8 g/dL — ABNORMAL LOW (ref 13.0–17.0)
Immature Granulocytes: 0 %
Lymphocytes Relative: 26 %
Lymphs Abs: 1.8 K/uL (ref 0.7–4.0)
MCH: 31.8 pg (ref 26.0–34.0)
MCHC: 36 g/dL (ref 30.0–36.0)
MCV: 88.3 fL (ref 80.0–100.0)
Monocytes Absolute: 0.4 K/uL (ref 0.1–1.0)
Monocytes Relative: 5 %
Neutro Abs: 4.5 K/uL (ref 1.7–7.7)
Neutrophils Relative %: 67 %
Platelets: 178 K/uL (ref 150–400)
RBC: 4.03 MIL/uL — ABNORMAL LOW (ref 4.22–5.81)
RDW: 14 % (ref 11.5–15.5)
WBC: 6.9 K/uL (ref 4.0–10.5)
nRBC: 0 % (ref 0.0–0.2)

## 2024-08-10 LAB — COMPREHENSIVE METABOLIC PANEL WITH GFR
ALT: 49 U/L — ABNORMAL HIGH (ref 0–44)
AST: 31 U/L (ref 15–41)
Albumin: 4.6 g/dL (ref 3.5–5.0)
Alkaline Phosphatase: 42 U/L (ref 38–126)
Anion gap: 17 — ABNORMAL HIGH (ref 5–15)
BUN: 17 mg/dL (ref 8–23)
CO2: 22 mmol/L (ref 22–32)
Calcium: 9 mg/dL (ref 8.9–10.3)
Chloride: 88 mmol/L — ABNORMAL LOW (ref 98–111)
Creatinine, Ser: 0.87 mg/dL (ref 0.61–1.24)
GFR, Estimated: >60 mL/min (ref >60)
Glucose, Bld: 89 mg/dL (ref 70–99)
Potassium: 4.3 mmol/L (ref 3.5–5.1)
Sodium: 127 mmol/L — ABNORMAL LOW (ref 135–145)
Total Bilirubin: 0.9 mg/dL (ref 0.0–1.2)
Total Protein: 6.7 g/dL (ref 6.5–8.1)

## 2024-08-10 LAB — URINE DRUG SCREEN
Amphetamines: NEGATIVE
Barbiturates: NEGATIVE
Benzodiazepines: NEGATIVE
Cocaine: NEGATIVE
Fentanyl: NEGATIVE
Methadone Scn, Ur: NEGATIVE
Opiates: NEGATIVE
Tetrahydrocannabinol: NEGATIVE

## 2024-08-10 MED ORDER — SODIUM CHLORIDE 0.9 % IV BOLUS
1000.0000 mL | Freq: Once | INTRAVENOUS | Status: AC
  Administered 2024-08-10: 1000 mL via INTRAVENOUS

## 2024-08-10 MED ORDER — CHLORDIAZEPOXIDE HCL 25 MG PO CAPS: ORAL_CAPSULE | ORAL | 0 refills | Status: AC

## 2024-08-10 NOTE — ED Notes (Signed)
 Pt went to the bathroom and did not get a sample. States he will be able to go again soon.

## 2024-08-10 NOTE — ED Notes (Signed)
 Pt ambulated to restroom without assistance. Denies dizziness at this time

## 2024-08-10 NOTE — ED Triage Notes (Signed)
 Drinking 2 pints per day of bourbon x 3 weeks. Last drink today. C/o head pressure and dizziness.

## 2024-08-10 NOTE — ED Notes (Signed)
Pt states he cannot provide a urine sample at this time.

## 2024-08-10 NOTE — ED Provider Notes (Signed)
 [place] EMERGENCY DEPARTMENT AT [facility] Provider Note   CSN: [id number] Arrival date & time: 08/10/24  1313     Patient presents with: Dizziness   Sean Booth is a 63 y.o. male.   Patient to ED for evaluation of dizziness describing lightheadedness when he goes from sitting to standing. No LOC/syncope. Symptoms x 2 days. He is also concerned his blood pressure and blood sugar have been abnormal. He reports compliance with all medications for HTN and T2DM. He also admits to heavy alcohol  use over the last several days. No headache, chest pain, SOB, cough, nausea/vomiting or diarrhea.   The history is provided by the patient. No language interpreter was used.  Dizziness      Prior to Admission medications   Medication Sig Start Date End Date Taking? Authorizing Provider  carvedilol  (COREG ) 12.5 MG tablet Take 1 tablet (12.5 mg total) by mouth 2 (two) times daily. 10/14/23   Shlomo Wilbert SAUNDERS, MD  chlordiazePOXIDE  (LIBRIUM ) 25 MG capsule 50mg  PO TID x 1D, then 25-50mg  PO BID X 1D, then 25-50mg  PO QD X 1D 08/10/24   Christropher Gintz, PA-C  famotidine  (PEPCID ) 20 MG tablet Take 1 tablet (20 mg total) by mouth 2 (two) times daily. 09/27/20 07/01/22  Cottie Donnice PARAS, MD  lisinopril  (ZESTRIL ) 40 MG tablet Take 1 tablet (40 mg total) by mouth daily. 10/14/23   Shlomo Wilbert SAUNDERS, MD  rosuvastatin  (CRESTOR ) 20 MG tablet TAKE 1 TABLET BY MOUTH DAILY 11/09/23   Shlomo Wilbert SAUNDERS, MD    Allergies: Patient has no known allergies.    Review of Systems  Neurological:  Positive for dizziness.    Updated Vital Signs BP 112/77   Pulse 77   Temp 97.6 F (36.4 C) (Oral)   Resp 15   Ht 5' 9 (1.753 m)   Wt 108.9 kg   SpO2 92%   BMI 35.44 kg/m   Physical Exam Vitals and nursing note reviewed.  Constitutional:      Appearance: Normal appearance. He is obese. He is not ill-appearing.  HENT:     Head: Normocephalic and atraumatic.     Mouth/Throat:     Mouth: Mucous membranes  are moist.  Eyes:     Extraocular Movements: Extraocular movements intact.     Conjunctiva/sclera: Conjunctivae normal.     Pupils: Pupils are equal, round, and reactive to light.  Neck:     Vascular: No carotid bruit.  Cardiovascular:     Rate and Rhythm: Normal rate and regular rhythm.     Heart sounds: No murmur heard. Pulmonary:     Effort: Pulmonary effort is normal.     Breath sounds: Normal breath sounds. No wheezing, rhonchi or rales.  Abdominal:     Palpations: Abdomen is soft.     Tenderness: There is no abdominal tenderness.  Musculoskeletal:        General: Normal range of motion.     Cervical back: Normal range of motion and neck supple.     Right lower leg: No edema.     Left lower leg: No edema.  Skin:    General: Skin is warm and dry.  Neurological:     Mental Status: He is alert and oriented to person, place, and time.     GCS: GCS eye subscore is 4. GCS verbal subscore is 5. GCS motor subscore is 6.     Cranial Nerves: No cranial nerve deficit, dysarthria or facial asymmetry.     Sensory: Sensation  is intact.     Motor: No weakness or pronator drift.     Coordination: Finger-Nose-Finger Test normal.     Deep Tendon Reflexes:     Reflex Scores:      Patellar reflexes are 2+ on the right side and 2+ on the left side.    (all labs ordered are listed, but only abnormal results are displayed) Labs Reviewed  CBC WITH DIFFERENTIAL/PLATELET - Abnormal; Notable for the following components:      Result Value   RBC 4.03 (*)    Hemoglobin 12.8 (*)    HCT 35.6 (*)    All other components within normal limits  ETHANOL - Abnormal; Notable for the following components:   Alcohol , Ethyl (B) 244 (*)    All other components within normal limits  COMPREHENSIVE METABOLIC PANEL WITH GFR - Abnormal; Notable for the following components:   Sodium 127 (*)    Chloride 88 (*)    ALT 49 (*)    Anion gap 17 (*)    All other components within normal limits  URINALYSIS,  ROUTINE W REFLEX MICROSCOPIC - Abnormal; Notable for the following components:   Color, Urine COLORLESS (*)    Specific Gravity, Urine <1.005 (*)    All other components within normal limits  LIPASE, BLOOD  URINE DRUG SCREEN    EKG: None  Radiology: No results found.   Procedures   Medications Ordered in the ED  sodium chloride  0.9 % bolus 1,000 mL (0 mLs Intravenous Stopped 08/10/24 1520)  sodium chloride  0.9 % bolus 1,000 mL (0 mLs Intravenous Stopped 08/10/24 1618)    Clinical Course as of 08/10/24 1729  Wed Aug 10, 2024  1722 Patient to the ED with c/o dizziness, seems to be positional. No syncope, no pain. Not vomiting. Daily drinker.   Labs significant for hyponatremia. He received 1.5 liters NS. EKG NSR. Labs otherwise reassuring. He is ambulatory now without dizziness.   On discharge discussion he states his main concern is the alcohol  dependence. No SI/HI/AVH. He reports librium  helped him in the past. Last used 2 years ago. He states he and his wife are considering inpatient but he is pursuing this from home. He is felt stable for discharge. Return precautions discussed.  [SU]    Clinical Course User Index [SU] Odell Balls, PA-C                                 Medical Decision Making Amount and/or Complexity of Data Reviewed Labs: ordered.        Final diagnoses:  Hyponatremia  Uncomplicated alcohol  dependence [facility])    ED Discharge Orders          Ordered    chlordiazePOXIDE  (LIBRIUM ) 25 MG capsule        08/10/24 1727               Odell Balls, PA-C 08/10/24 1729    Bernard Drivers, MD 08/16/24 1430

## 2024-08-10 NOTE — Discharge Instructions (Signed)
 As we discussed your labs and exam are reassuring. Librium  has been prescribed and sent to your pharmacy. Take as prescribed. Return to the ED with any symptoms concerning for withdrawal at any time, or for new concerns.
